# Patient Record
Sex: Male | Born: 1965 | Race: White | Hispanic: No | Marital: Married | Smoking: Never smoker
Health system: Southern US, Community
[De-identification: ages and names within clinical notes are randomized; demographics above are authoritative.]

## PROBLEM LIST (undated history)

## (undated) DIAGNOSIS — T8859XA Other complications of anesthesia, initial encounter: Secondary | ICD-10-CM

## (undated) DIAGNOSIS — M1990 Unspecified osteoarthritis, unspecified site: Secondary | ICD-10-CM

## (undated) DIAGNOSIS — K2970 Gastritis, unspecified, without bleeding: Secondary | ICD-10-CM

## (undated) DIAGNOSIS — K219 Gastro-esophageal reflux disease without esophagitis: Secondary | ICD-10-CM

## (undated) DIAGNOSIS — Z86010 Personal history of colonic polyps: Principal | ICD-10-CM

## (undated) DIAGNOSIS — T4145XA Adverse effect of unspecified anesthetic, initial encounter: Secondary | ICD-10-CM

## (undated) HISTORY — PX: LUMBAR FUSION: SHX111

## (undated) HISTORY — DX: Unspecified osteoarthritis, unspecified site: M19.90

## (undated) HISTORY — PX: NECK SURGERY: SHX720

## (undated) HISTORY — PX: UPPER GI ENDOSCOPY: SHX6162

## (undated) HISTORY — PX: BACK SURGERY: SHX140

## (undated) HISTORY — DX: Personal history of colonic polyps: Z86.010

---

## 2004-01-07 ENCOUNTER — Emergency Department (HOSPITAL_COMMUNITY): Admission: EM | Admit: 2004-01-07 | Discharge: 2004-01-07 | Payer: Self-pay | Admitting: Emergency Medicine

## 2005-10-19 ENCOUNTER — Inpatient Hospital Stay (HOSPITAL_COMMUNITY): Admission: RE | Admit: 2005-10-19 | Discharge: 2005-10-23 | Payer: Self-pay | Admitting: Neurosurgery

## 2005-10-21 ENCOUNTER — Ambulatory Visit: Payer: Self-pay | Admitting: Cardiology

## 2005-10-22 ENCOUNTER — Encounter: Payer: Self-pay | Admitting: Internal Medicine

## 2008-09-03 DIAGNOSIS — K2100 Gastro-esophageal reflux disease with esophagitis, without bleeding: Secondary | ICD-10-CM | POA: Insufficient documentation

## 2009-05-19 DIAGNOSIS — I10 Essential (primary) hypertension: Secondary | ICD-10-CM | POA: Insufficient documentation

## 2009-06-19 ENCOUNTER — Ambulatory Visit (HOSPITAL_COMMUNITY): Admission: RE | Admit: 2009-06-19 | Discharge: 2009-06-20 | Payer: Self-pay | Admitting: Neurosurgery

## 2010-03-30 DIAGNOSIS — L989 Disorder of the skin and subcutaneous tissue, unspecified: Secondary | ICD-10-CM | POA: Insufficient documentation

## 2010-08-31 ENCOUNTER — Ambulatory Visit: Payer: Self-pay | Admitting: Family Medicine

## 2011-03-21 LAB — URINALYSIS, ROUTINE W REFLEX MICROSCOPIC
Bilirubin Urine: NEGATIVE
Glucose, UA: NEGATIVE mg/dL
Hgb urine dipstick: NEGATIVE
Ketones, ur: NEGATIVE mg/dL
Nitrite: NEGATIVE
Protein, ur: NEGATIVE mg/dL
Specific Gravity, Urine: 1.025 (ref 1.005–1.030)
Urobilinogen, UA: 0.2 mg/dL (ref 0.0–1.0)
pH: 6 (ref 5.0–8.0)

## 2011-03-21 LAB — PROTIME-INR
INR: 0.9 (ref 0.00–1.49)
Prothrombin Time: 12.5 seconds (ref 11.6–15.2)

## 2011-03-21 LAB — CBC
HCT: 43.3 % (ref 39.0–52.0)
Hemoglobin: 15.3 g/dL (ref 13.0–17.0)
MCHC: 35.3 g/dL (ref 30.0–36.0)
MCV: 88.8 fL (ref 78.0–100.0)
Platelets: 177 10*3/uL (ref 150–400)
RBC: 4.88 MIL/uL (ref 4.22–5.81)
RDW: 14 % (ref 11.5–15.5)
WBC: 6.8 10*3/uL (ref 4.0–10.5)

## 2011-03-21 LAB — DIFFERENTIAL
Basophils Absolute: 0 10*3/uL (ref 0.0–0.1)
Basophils Relative: 0 % (ref 0–1)
Eosinophils Absolute: 0.5 10*3/uL (ref 0.0–0.7)
Eosinophils Relative: 7 % — ABNORMAL HIGH (ref 0–5)
Lymphocytes Relative: 32 % (ref 12–46)
Lymphs Abs: 2.2 10*3/uL (ref 0.7–4.0)
Monocytes Absolute: 0.5 10*3/uL (ref 0.1–1.0)
Monocytes Relative: 8 % (ref 3–12)
Neutro Abs: 3.6 10*3/uL (ref 1.7–7.7)
Neutrophils Relative %: 53 % (ref 43–77)

## 2011-03-21 LAB — BASIC METABOLIC PANEL
BUN: 12 mg/dL (ref 6–23)
CO2: 26 mEq/L (ref 19–32)
Calcium: 9.1 mg/dL (ref 8.4–10.5)
Chloride: 109 mEq/L (ref 96–112)
Creatinine, Ser: 0.87 mg/dL (ref 0.4–1.5)
GFR calc Af Amer: >60 mL/min (ref >60)
GFR calc non Af Amer: >60 mL/min (ref >60)
Glucose, Bld: 101 mg/dL — ABNORMAL HIGH (ref 70–99)
Potassium: 4.6 mEq/L (ref 3.5–5.1)
Sodium: 140 mEq/L (ref 135–145)

## 2011-03-21 LAB — APTT: aPTT: 28 seconds (ref 24–37)

## 2011-04-27 NOTE — Op Note (Signed)
NAME:  Maxwell Brown, Maxwell Brown NO.:  [id number]   MEDICAL RECORD NO.:  [id number]          PATIENT TYPE:  OIB   LOCATION:  3523                         FACILITY:  [facility]   PHYSICIAN:  Clydene Fake, M.D.  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   POSTOPERATIVE DIAGNOSES:  Herniated nucleus pulposus,spondylosis,  cervical stenosis at C5-6 and 6-7 with right-sided radiculopathy and  radiculomyelopathy.   PROCEDURES:  Anterior cervical decompression, diskectomy, and fusion at  C5-6 and 6-7 with LifeNet allograft bone, Trestle anterior cervical  plate.   SURGEON:  Clydene Fake, MD   ASSISTANT:  Hewitt Shorts, MD   ANESTHESIA:  General endotracheal tube anesthesia.   ESTIMATED BLOOD LOSS:  Minimal.   BLOOD GIVEN:  None.   DRAINS:  None.   COMPLICATIONS:  None.   INDICATIONS FOR PROCEDURE:  The patient is a 45 year old gentleman with  neck and right arm pain and numbness.  MRI shows spondylitic changes at  5-6, 6-7 with spurring and disk protrusion that caused some canal  stenosis at 5-6 and mild canal stenosis at 6-7, 5-6 worse to the right  side and irritating that root and 6-7 made actually worse to the left  but still some bifemoral narrowing, and the patient brought in for  decompression and fusion.   PROCEDURE IN DETAIL:  The patient was brought into the operating room  and general anesthesia was induced.  The patient was placed in a 10-  pound halter traction, prepped and draped in the sterile fashion.  Site  of incision injected with 10 mL of 1% lidocaine with epinephrine.  Incision was then made from the midline to the anterior border of the  sternocleidomastoid muscle on the left side.  The neck incision  was  taken down to the platysma and hemostasis was obtained with Bovie  cauterization.  The platysma muscle was incised with a Bovie and blunt  dissection taken through the anterior cervical fascia up to the anterior  cervical spine.  Needle was placed in interspace.  X-rays were obtained  showing this was at the 5-6 interspace.  Disk space was incised and  partial diskectomy was started with pituitary rongeurs.  Longus coli  muscle was reflected laterally from C5 through 7 and the self-retaining  retractor was placed and we can see both disk spaces.  Diskectomy then  continued by incising the disk space using pituitary rongeurs and  Kerrison punches removed anterior osteophytes.  We continued with the  diskectomy.  Distraction pins were placed in C5 and C7, and the  interspaces were distracted.  Microscope was brought in for  microdissection at this point and diskectomy continued with curettes,  pituitary rongeurs, and then 1- and 2-mm Kerrison punches were used to  remove posterior osteophyte disk and ligament decompressing the central  canal and then performing bilateral foraminotomies.  When we were  finished at the 5-6 level, we had good central decompression and  bilateral foramen were opened.  Nerve roots were well decompressed.  We  used a high-speed drill to remove cartilaginous endplate.  We measured  the height of disk space to be 5 mm and a 5-mm LifeNet allograft bone  was tapped into place, countersunk a couple of millimeters.  We checked  with a hook and there was plenty around and between the bone graft and  dura posteriorly.  We irrigated with antibiotic solution.  Attention was  then taken to the 6-7 level where diskectomy continued with pituitary  rongeurs and curettes and 1- and 2-mm Kerrison punches were used to  remove posterior disk osteophyte and ligament, decompressing the central  canal, and then bilateral foraminotomies were done decompressing the  nerve roots  bilaterally.  High-speed drill was used to remove  cartilaginous endplate.  We measured the height of disk space to be 6 mm  and a 6-mm LifeNet allograft bone was tapped into place, countersunk a  couple of millimeters.  Distraction pins were removed.  Weight was  removed from the traction and bone plugs were firmly in place.  We  irrigated with antibiotic solution.  We had good hemostasis.  Anterior  osteophytes were removed with Leksell rongeur, and the Trestle anterior  cervical plate was placed over the anterior cervical spine with two  screws placed in the C5, two in the C6, two in the C7 and these were  tightened down.  Lateral x-rays were obtained showing bone plugs, plate  and screws at 5-6 and 6-7 level.  We irrigated with antibiotic solution.  We had good hemostasis with bipolar cauterization and Gelfoam and  thrombin.  Gelfoam was irrigated out.  We had very good hemostasis, and  the platysma was closed with 3-0 Vicryl interrupted sutures,  subcutaneous tissue closed with the same.  Skin closed with benzoin and  Steri-Strips dressing was placed.  The patient was placed in a soft  cervical collar, awakened from anesthesia, and transferred to the  recovery room in stable condition.           ______________________________  Clydene Fake, M.D.     JRH/MEDQ  D:  06/19/2009  T:  06/19/2009  Job:  [id number]

## 2011-04-30 NOTE — Consult Note (Signed)
NAME:  Maxwell Brown, BEAVERS NO.:  [id number]   MEDICAL RECORD NO.:  [id number]          PATIENT TYPE:  INP   LOCATION:  3014                         FACILITY:  [facility]   PHYSICIAN:  Willa Rough, M.D.     DATE OF BIRTH:  May 31, 1966   DATE OF CONSULTATION:  DATE OF DISCHARGE:                                   CONSULTATION   [name] is a very pleasant, healthy, 45 year old gentleman.  He has had  a neurosurgical procedure to L5 and S1 for spondylolisthesis and foraminal  narrowing and degenerative disease and spondylosis.  He had a decompression  and fusion.   The patient has no significant medical problems.  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  He has no other problems and he  takes no significant medications.   On the days since surgery, the patient has had several episodes of syncope  and presyncope.  These have been witnessed.  On one occasion, he was sitting  and then began to feel poorly in general.  He had diaphoresis, and it was  noted that his heart rate increased to approximately 160 (there was no  monitor strip).  The patient's eyes then rolled back, and he was unconscious  very briefly.  There was no significant seizure activity noted.  He then  woke up.  This same type of event has occurred two or three times.   There has been no chest pain.  The patient has no prior history of  palpitations.   PAST MEDICAL HISTORY:   ALLERGIES:  THERE ARE NO KNOWN DRUG ALLERGIES.   MEDICATION:  The patient takes no regular medications.   OTHER MEDICAL PROBLEMS:  See the complete list below.   SOCIAL HISTORY:  The patient is employed as an Engineer, maintenance.  He is  married and does not smoke.   FAMILY HISTORY:  There is no strong family history of coronary disease.  There is no history of sudden cardiac death.   REVIEW OF SYSTEMS:  The patient has no significant complaints at this time.  Of course, he has some discomfort after  his back surgery and this is  improving.  He has not had a tremendous amount of pain.  He used only a  small amount of morphine from the morphine pump after his procedure.  His  review of systems otherwise is negative.   PHYSICAL EXAMINATION:  Blood pressure is at this time 140/70.  His pulse is  around 95.  I could not stand him up to check orthostatics because of his  surgery.  His temperature is 98.7, respirations 18.  The patient is oriented  to person, time and place.  Affect is normal.  Lungs are clear.  Respiratory  effort is not labored.  HEENT reveals no xanthelasma.  He has normal  extraocular motion.  There are no carotid bruits.  There is no jugular  venous distention.  Cardiac exam reveals an S1 with an S2.  There are no  clicks or significant murmurs.  The abdomen is soft.  There are no masses or  bruits.  There is no peripheral edema.  The patient of course is in bed  after his back surgery.   EKG reveals no significant abnormalities.  Other lab studies show no  significant abnormalities.   PROBLEMS INCLUDE:  1.  Status post low back surgery as described above.  2.  Episodes of presyncope and syncope.  This has occurred only here in the      hospital since his surgery.  As mentioned, these have been witnessed.      There is some warning.  There is also some tachycardia.  I believe that      the patient may have a vasovagal component to these episodes.  It is      also possible that he is having bursts of supraventricular tachycardia.      I do believe that he is mildly volume depleted at this time.   PLAN:  1.  Orthostatic blood pressure checks to be done carefully by the OT and PT      people.  2.  TSH.  3.  Place the patient on telemetry on __________  4.  2D echo.  5.  Increase his volume status with IV fluid.   I will follow these results and see the patient in followup.           ______________________________  Willa Rough, M.D.     JK/MEDQ  D:   10/21/2005  T:  10/21/2005  Job:  [id number]   cc:   Clydene Fake, M.D.  Fax: [phone]

## 2011-04-30 NOTE — Discharge Summary (Signed)
NAME:  Maxwell Brown, Maxwell Brown NO.:  [id number]   MEDICAL RECORD NO.:  [id number]          PATIENT TYPE:  INP   LOCATION:  3014                         FACILITY:  [facility]   PHYSICIAN:  Hilda Lias, M.D.   DATE OF BIRTH:  1966-01-01   DATE OF ADMISSION:  10/19/2005  DATE OF DISCHARGE:  10/23/2005                                 DISCHARGE SUMMARY   ADMISSION DIAGNOSES:  L5-S1 spondylolisthesis with chronic radiculopathy.   FINAL DIAGNOSES:  L5-S1 spondylolisthesis with chronic radiculopathy.   CLINICAL HISTORY:  The patient was admitted because of back pain with  radiation to both legs.  X-rays showed that he has spondylolisthesis at the  level of 5-1.  Surgery was advised.   LABORATORY DATA:  Normal.   COURSE IN HOSPITAL:  The patient was taken to surgery, and L5 Gill procedure  was done followed by interbody fusion and posterior arthrodesis with pedicle  screws.  The patient did really well, but he developed some difficulty with  fainting on several occasions.  The patient had a CBC which was within  normal limits.  The patient had evaluation by the cardiologist, and the  workup was negative.  Today he was seen by the cardiologist, and he is  cleared to go home without restriction.  There recommendation would be only  associated with the lumbar fusion.   CONDITION ON DISCHARGE:  Improvement.   DISCHARGE MEDICATIONS:  Percocet and Flexeril.   DIET:  Regular.   ACTIVITY:  The patient is not to drive for at least 10 days.  He is not to  bathe.  He is not to do any heavy lifting.   FOLLOW UP:  He is told to see Dr. Phoebe Perch in the next 3 to 4 weeks.           ______________________________  Hilda Lias, M.D.     EB/MEDQ  D:  10/23/2005  T:  10/24/2005  Job:  [id number]

## 2011-04-30 NOTE — Op Note (Signed)
NAME:  Maxwell Brown, Maxwell Brown NO.:  [id number]   MEDICAL RECORD NO.:  [id number]          PATIENT TYPE:  INP   LOCATION:  2899                         FACILITY:  [facility]   PHYSICIAN:  Clydene Fake, M.D.  DATE OF BIRTH:  05-Feb-1966   DATE OF PROCEDURE:  10/19/2005  DATE OF DISCHARGE:                                 OPERATIVE REPORT   PREOPERATIVE DIAGNOSES:  1.  Spondylolisthesis of L5-S1.  2.  Spondylosis.  3.  Degenerative disease with foraminal stenosis.   POSTOPERATIVE DIAGNOSES:  1.  Spondylolisthesis of L5-S1.  2.  Spondylosis.  3.  Degenerative disease with foraminal stenosis.   PROCEDURE:  1.  Gill decompressive laminectomy at L5-S1.  2.  Posterior lumbar interbody fusion, L5-S1.  3.  Saber interbody cage at L5-S1.  4.  Expedium nonsegmented pedicle screw fixation at L5-S1.  5.  Posterolateral fusion at L5-S1, autograft same incision.  6.  Symphony pedicle graft substitute (conduit).   SURGEON:  Clydene Fake, MD.   ASSISTANT:  Hilda Lias, MD.   ANESTHESIA:  General endotracheal tube.   ESTIMATED BLOOD LOSS:  1000 mL.   BLOOD REPLACED:  2 units Cell-Saver returned.   COMPLICATIONS:  None.   DRAINS:  None.   REASON FOR PROCEDURE:  The patient is a 45 year old, who two months ago had  back and right leg pain and also started getting some left leg pain, with  spondylolisthesis and retrolisthesis at L5-S1 with foraminal narrowing,  degenerative disease, and spondylosis.  The patient is brought in for  decompression and fusion.   PROCEDURE IN DETAIL:  The patient to the operating room, and a general  anesthesia was induced.  The patient was placed in the prone position on a  Wilson frame with all pressure points padded.  The patient was prepped and  draped in the usual sterile fashion.  The spinal incision was injected with  20 mL of 1% Lidocaine with epinephrine.  An incision was then made in the  midline, and a lower lumbar spine incision  was taken down to the fascia.  Hemostasis was obtained with Bovie cauterization.  The fascia was incised  with a Bovie, and subperiosteal dissection was done over the L4-5 and S1  spinous processes and lamina out to the facets bilaterally.  Fluoroscopic  imaging was used as markers.  X-ray confirmed our positioning.  Dissection  went out laterally to expose the lateral sacrum and the transverse processes  of L5.  A self-retaining retractor was placed.  Decompressive laminectomy  was then done, doing the L5 spinous process and lamina, then the medial  facets, and then decompressing through the pars bilaterally.  This  decompressed the 5 roots as they came out their foramen.  This was done with  Leksell rongeurs, Kerrison punches, and high-speed drills.  All bone was  saved, cleaned from its soft tissue, and chopped up into small pieces to use  later in the case for the fusion.  Foraminotomies were done over the S1  roots, again the 5 roots were decompressed as they went out laterally.  We  explored the disk space, the disk space was incised bilaterally.  Diskectomy  was performed, then interbody distractors were placed to distract up to 11  mm, and this reduced the spondylolisthesis.  We prepared the interbody space  for interbody cage fusion by using the various approaches removing the disk,  scraping the end plates, using curettes to scrape the end plate and push  this down and remove some lateral disk from both foramen on each side.  When  we were finished again, we did further decompression of the 5 roots  bilaterally, then the disk space readied for interbody fusion.  All the  autograft bone that was cleaned and chopped into small pieces was placed  through the Synthes system to give the plate rich plasma.  This bone mixture  was packed into two 11-high x 9-wide Saber cages, and the rest of the bone  graft packed into the interbody space.  With the Honan distraction on one  side, we  tapped the cage in the other, counter-sinking it a few millimeters,  and removed the interspace spreader, packed more bone in the interspace and  topped the second cage into place.  We had good position of the cages.  We  left this space open, and the nerve roots were decompressed.  Gelfoam and  thrombin were placed in the epidural space for hemostasis, and then we  decorticated the lateral facets and transverse processes and lateral sacrum  from L5 and S1 bilaterally.  Using fluoroscopy, point of entry points were  found at L5 on the left.  We placed a probe down the pedicle, placed a small  ball probe to fill the edges.  We had bony edges all the way around the top  of the pedicle and then placed a 15 mm x 6 mm Expedium screw.  The S1  pedicle was then found on this left side.  A 45 x 6 mm Expedium screw was  then placed.  This was repeated on the right side, the same screw sizes were  used.  Rods were placed into the screw heads, locking nuts were then placed,  and these were tightened down.  Final AP and lateral fluoroscopic imaging  was then obtained showing good position of screws, rods, and the interbody  bone plug was in good alignment of the spine.  The skin was irrigated with  antibiotic solution, and the rest of the autograft Symphony bone and  allograft substitute conduit were mixed with blood and bone marrow blood  from the pedicle walls.  This was then packed into the posterolateral space  for posterolateral fusion L5 to S1 bilaterally.  We then scored the nerve  roots again.  We had good decompression.  We had good hemostasis.  The  Gelfoam was left over the epidural space so that the bone fragments would  not compress the nerve roots.  The retractors were removed.  We had good  hemostasis.  The paraspinous muscles and then the fascia were closed with 0 Vicryl interrupted suture, the subcutaneous tissue closed with 0, 2-0, and 3-  0 vertical interrupted sutures, and the skin  closed with Benzoin and Steri-  Strips.  A dressing was placed.  The patient was placed back into the supine  position, awoke from anesthesia, and transported to the recovery room in  stable condition.           ______________________________  Clydene Fake, M.D.     JRH/MEDQ  D:  10/19/2005  T:  10/19/2005  Job:  [id number]

## 2011-10-29 ENCOUNTER — Ambulatory Visit (INDEPENDENT_AMBULATORY_CARE_PROVIDER_SITE_OTHER): Payer: PRIVATE HEALTH INSURANCE | Admitting: Gastroenterology

## 2011-10-29 ENCOUNTER — Encounter: Payer: Self-pay | Admitting: Gastroenterology

## 2011-10-29 VITALS — BP 102/70 | HR 72 | Ht 70.0 in | Wt 265.4 lb

## 2011-10-29 DIAGNOSIS — R1013 Epigastric pain: Secondary | ICD-10-CM | POA: Insufficient documentation

## 2011-10-29 DIAGNOSIS — M159 Polyosteoarthritis, unspecified: Secondary | ICD-10-CM | POA: Insufficient documentation

## 2011-10-29 NOTE — Progress Notes (Signed)
History of Present Illness:  This is a very pleasant 45 year old Caucasian male with degenerative arthritis of his large joints and back with previous multiple orthopedic procedures. His has been on NSAID therapy for several years, and developed a severe abdominal pain 3 months ago in the epigastric area which is currently responded well to Prilosec 20 mg a day. He denies lower gastrointestinal issues or rectal bleeding. The patient also denies any specific hepatobiliary complaints, and is scheduled ultrasound exam on November 20. He has not had previous barium studies or endoscopic procedures. His appetite is good, and his weight has been stable, and he denies any food intolerances. Family history remarkable for gallbladder disease in both his parents. Review of lab shows normal CBC and metabolic profile including liver function tests. There is a possible history of melena several months ago. He currently is using when necessary acetaminophen for pain.  I have reviewed this patient's present history, medical and surgical past history, allergies and medications.    Past Medical History  Diagnosis Date  . Arthritis    Past Surgical History  Procedure Date  . Back surgery   . Neck surgery     reports that he has never smoked. He has never used smokeless tobacco. He reports that he does not drink alcohol or use illicit drugs. family history includes Diabetes in his mother.  There is no history of Colon cancer. No Known Allergies    ROS: ... Has chronic back pain and diffuse myalgias. He denies current cardiopulmonary or urologic problems.     Physical Exam: General well developed well nourished patient in no acute distress, appearing his stated age Eyes PERRLA, no icterus, fundoscopic exam per opthamologist Skin no lesions noted Neck supple, no adenopathy, no thyroid enlargement, no tenderness Chest clear to percussion and auscultation Heart no significant murmurs, gallops or rubs  noted Abdomen no hepatosplenomegaly masses or tenderness, BS normal.  Extremities no acute joint lesions, edema, phlebitis or evidence of cellulitis. Neurologic patient oriented x 3, cranial nerves intact, no focal neurologic deficits noted. Psychological mental status normal and normal affect.  Assessment and plan: Probable NSAID induced gastric ulceration responding to PPI therapy. I've scheduled him for endoscopic exam, and we will do exams for H. pylori. Ultrasound scheduled as per above. He is to continue daily Prilosec with twice a day usage if needed. Hopefully he will be able to resume  NSAIDs once his endoscopic exam clarifies  the situation.  Encounter Diagnoses  Name Primary?  . Abdominal pain, epigastric Yes  . DJD (degenerative joint disease), multiple sites

## 2011-10-29 NOTE — Patient Instructions (Signed)
You have been scheduled for an Endoscopy with separate instructions given. Please continue the Prilosec.

## 2011-11-02 ENCOUNTER — Ambulatory Visit: Payer: Self-pay | Admitting: Family Medicine

## 2011-11-03 ENCOUNTER — Encounter: Payer: Self-pay | Admitting: Gastroenterology

## 2011-11-03 ENCOUNTER — Ambulatory Visit (AMBULATORY_SURGERY_CENTER): Payer: PRIVATE HEALTH INSURANCE | Admitting: Gastroenterology

## 2011-11-03 VITALS — BP 138/81 | HR 67 | Temp 96.3°F | Resp 14 | Ht 70.0 in | Wt 265.0 lb

## 2011-11-03 DIAGNOSIS — R1013 Epigastric pain: Secondary | ICD-10-CM

## 2011-11-03 DIAGNOSIS — K2970 Gastritis, unspecified, without bleeding: Secondary | ICD-10-CM

## 2011-11-03 MED ORDER — SODIUM CHLORIDE 0.9 % IV SOLN: 500.0000 mL | INTRAVENOUS | Status: DC

## 2011-11-03 NOTE — Progress Notes (Signed)
Propofol administered by s.[facility] crna per protocol. See scanned intra procedure report. ewm  200mg  propofol administered by [facility] crna for procedure. Procedure tolerated well per pt. ewm

## 2011-11-03 NOTE — Progress Notes (Signed)
Patient did not experience any of the following events: a burn prior to discharge; a fall within the facility; wrong site/side/patient/procedure/implant event; or a hospital transfer or hospital admission upon discharge from the facility. (G8907) Patient did not have preoperative order for IV antibiotic SSI prophylaxis. (G8918)  

## 2011-11-03 NOTE — Patient Instructions (Signed)
Please refer to the neon green sheet for instructions regarding activity for the rest of today.  Normal examination. Resume previous medications.

## 2011-11-05 DIAGNOSIS — K2970 Gastritis, unspecified, without bleeding: Secondary | ICD-10-CM

## 2011-11-05 DIAGNOSIS — K2990 Gastroduodenitis, unspecified, without bleeding: Secondary | ICD-10-CM

## 2011-11-05 LAB — HELICOBACTER PYLORI SCREEN-BIOPSY: UREASE: NEGATIVE

## 2011-11-08 ENCOUNTER — Telehealth: Payer: Self-pay | Admitting: *Deleted

## 2011-11-08 ENCOUNTER — Encounter: Payer: Self-pay | Admitting: Gastroenterology

## 2011-11-08 NOTE — Telephone Encounter (Signed)

## 2013-12-12 ENCOUNTER — Ambulatory Visit: Payer: Self-pay | Admitting: Family Medicine

## 2014-04-23 LAB — BASIC METABOLIC PANEL
BUN: 16 mg/dL (ref 4–21)
CREATININE: 0.9 mg/dL (ref <=1.3)
Glucose: 96 mg/dL
SODIUM: 145 mmol/L (ref 137–147)

## 2014-04-23 LAB — CBC AND DIFFERENTIAL
NEUTROS ABS: 51 /uL
WBC: 5.6 10*3/mL

## 2014-04-23 LAB — HEPATIC FUNCTION PANEL
ALT: 15 U/L (ref 10–40)
AST: 16 U/L (ref 14–40)
Alkaline Phosphatase: 58 U/L (ref 25–125)
Bilirubin, Total: 0.9 mg/dL

## 2014-04-23 LAB — PSA: PSA: 0.9

## 2014-04-23 LAB — LIPID PANEL
CHOLESTEROL: 157 mg/dL (ref 0–200)
HDL: 50 mg/dL (ref 35–70)
LDL Cholesterol: 92 mg/dL
LDl/HDL Ratio: 1.8
TRIGLYCERIDES: 73 mg/dL (ref 40–160)

## 2014-04-23 LAB — TSH: TSH: 1.55 u[IU]/mL (ref <=5.90)

## 2014-10-25 LAB — HEMOGLOBIN A1C: Hgb A1c MFr Bld: 5.3 % (ref 4.0–6.0)

## 2014-11-01 ENCOUNTER — Ambulatory Visit (INDEPENDENT_AMBULATORY_CARE_PROVIDER_SITE_OTHER): Payer: BC Managed Care – PPO

## 2014-11-01 ENCOUNTER — Ambulatory Visit (INDEPENDENT_AMBULATORY_CARE_PROVIDER_SITE_OTHER): Payer: BC Managed Care – PPO | Admitting: Podiatry

## 2014-11-01 ENCOUNTER — Encounter: Payer: Self-pay | Admitting: Podiatry

## 2014-11-01 VITALS — BP 114/74 | HR 76 | Resp 16 | Ht 70.0 in | Wt 260.0 lb

## 2014-11-01 DIAGNOSIS — M722 Plantar fascial fibromatosis: Secondary | ICD-10-CM

## 2014-11-01 MED ORDER — DICLOFENAC SODIUM 75 MG PO TBEC: 75.0000 mg | DELAYED_RELEASE_TABLET | Freq: Two times a day (BID) | ORAL | Status: DC

## 2014-11-01 MED ORDER — TRIAMCINOLONE ACETONIDE 10 MG/ML IJ SUSP
10.0000 mg | Freq: Once | INTRAMUSCULAR | Status: AC
  Administered 2014-11-01: 10 mg

## 2014-11-01 NOTE — Patient Instructions (Signed)

## 2014-11-01 NOTE — Progress Notes (Signed)
   Subjective:    Patient ID: Maxwell Brown, male    DOB: 09/30/1966, 48 y.o.   MRN: [id number]  HPI Comments: i have plantar fasciitis in my left heel. Ive had it since may. It does hurt and the pain comes and goes. It gets worse at times. Walking and standing bothers me. i wear otc inserts, used frozen water bottle, cold packs and that's it.  Foot Pain      Review of Systems  Musculoskeletal:       Joint pain Back pain Difficulty walking  All other systems reviewed and are negative.      Objective:   Physical Exam        Assessment & Plan:

## 2014-11-02 NOTE — Progress Notes (Signed)
Subjective:     Patient ID: Maxwell Brown, male   DOB: 04-30-1966, 48 y.o.   MRN: [id number]  HPI patient states that he's had a lot of pain in his left heel for about 6 months. He states that he tries to be active but is not been able to that he's been using over-the-counter insoles and using a frozen water bottle without relief of symptoms. Has had a history of this in the past   Review of Systems  All other systems reviewed and are negative.      Objective:   Physical Exam  Constitutional: He is oriented to person, place, and time.  Cardiovascular: Intact distal pulses.   Musculoskeletal: Normal range of motion.  Neurological: He is oriented to person, place, and time.  Skin: Skin is warm.  Nursing note and vitals reviewed.  neurovascular status intact muscle strength adequate with range of motion of the subtalar and midtarsal joint within normal limits. Patient is noted to have severe discomfort plantar aspect left heel at the insertion of the tendon into the calcaneus and is noted to have depression of the arch upon weightbearing. 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 Placed on diclofenac 75 mg twice a day and reappoint in 2 weeks and also discussed long-term orthotics

## 2014-11-22 ENCOUNTER — Ambulatory Visit (INDEPENDENT_AMBULATORY_CARE_PROVIDER_SITE_OTHER): Payer: BC Managed Care – PPO | Admitting: Podiatry

## 2014-11-22 DIAGNOSIS — M722 Plantar fascial fibromatosis: Secondary | ICD-10-CM

## 2014-11-23 NOTE — Progress Notes (Signed)
Subjective:     Patient ID: Maxwell Brown, male   DOB: [birth year], 48 y.o.   MRN: [id number]  HPI patient states that my heel is quite a bit improved but it is still painful and I don't know if I need another shot. I've had this a long time and been treated by other physicians and it does feel pretty good but still symptomatic   Review of Systems     Objective:   Physical Exam Neurovascular status unchanged with continued discomfort plantar aspect left heel with moderate inflammation and fluid buildup noted. Patient does have moderate depression of the arch which is complicating factor    Assessment:     Biomechanical dysfunction with chronic plantar fascial symptomatology left    Plan:     Reviewed condition and recommended physical therapy supportive shoe and scanned for custom orthotics to reduce plantar pressure. Want to avoid more cortisone if we can but may be necessary again in the future depending on how symptoms proceed

## 2014-12-27 ENCOUNTER — Ambulatory Visit (INDEPENDENT_AMBULATORY_CARE_PROVIDER_SITE_OTHER): Payer: 59 | Admitting: *Deleted

## 2014-12-27 DIAGNOSIS — M722 Plantar fascial fibromatosis: Secondary | ICD-10-CM

## 2014-12-27 NOTE — Patient Instructions (Signed)

## 2014-12-27 NOTE — Progress Notes (Signed)
Orthotics picked up. Given instructions. Will see in 1 mo.   for evaluation.

## 2015-01-14 ENCOUNTER — Other Ambulatory Visit (HOSPITAL_COMMUNITY): Payer: Self-pay | Admitting: Neurosurgery

## 2015-01-21 NOTE — Pre-Procedure Instructions (Signed)
Maxwell Brown  01/21/2015   Your procedure is scheduled on:  Friday, Feb. 12th   Report to [facility] Admitting at 10:00  AM.   Call this number if you have problems the morning of surgery: [phone]   Remember:   Do not eat food or drink liquids after midnight Thursday.   Take these medicines the morning of surgery with A SIP OF WATER: Omeprazole   Do not wear jewelry - no rings or watches.  Do not wear lotions or colognes.  You may NOT wear deodorant day of surgery.   Men may shave face and neck.   Do not bring valuables to the hospital.  [facility] is not responsible for any belongings or valuables.               Contacts, dentures or bridgework may not be worn into surgery.  Leave suitcase in the car. After surgery it may be brought to your room.  For patients admitted to the hospital, discharge time is determined by your treatment team.               Patients discharged the day of surgery will not be allowed to drive home.   Name and phone number of your driver:    Special Instructions: "Preparing for Surgery" instruction sheet.   Please read over the following fact sheets that you were given: Pain Booklet, Coughing and Deep Breathing, Blood Transfusion Information, MRSA Information and Surgical Site Infection Prevention

## 2015-01-22 ENCOUNTER — Encounter (HOSPITAL_COMMUNITY): Payer: Self-pay

## 2015-01-22 ENCOUNTER — Encounter (HOSPITAL_COMMUNITY)
Admission: RE | Admit: 2015-01-22 | Discharge: 2015-01-22 | Disposition: A | Discharge status: Home / Self Care | Payer: 59 | Source: Ambulatory Visit | Attending: Neurosurgery | Admitting: Neurosurgery

## 2015-01-22 HISTORY — DX: Gastritis, unspecified, without bleeding: K29.70

## 2015-01-22 HISTORY — DX: Other complications of anesthesia, initial encounter: T88.59XA

## 2015-01-22 HISTORY — DX: Adverse effect of unspecified anesthetic, initial encounter: T41.45XA

## 2015-01-22 HISTORY — DX: Gastro-esophageal reflux disease without esophagitis: K21.9

## 2015-01-22 LAB — SURGICAL PCR SCREEN
MRSA, PCR: NEGATIVE
Staphylococcus aureus: NEGATIVE

## 2015-01-22 LAB — CBC
HEMATOCRIT: 44.3 % (ref 39.0–52.0)
Hemoglobin: 15.1 g/dL (ref 13.0–17.0)
MCH: 30.3 pg (ref 26.0–34.0)
MCHC: 34.1 g/dL (ref 30.0–36.0)
MCV: 89 fL (ref 78.0–100.0)
PLATELETS: 183 10*3/uL (ref 150–400)
RBC: 4.98 MIL/uL (ref 4.22–5.81)
RDW: 13.6 % (ref 11.5–15.5)
WBC: 6.3 10*3/uL (ref 4.0–10.5)

## 2015-01-22 LAB — BASIC METABOLIC PANEL
Anion gap: 7 (ref 5–15)
BUN: 11 mg/dL (ref 6–23)
CHLORIDE: 110 mmol/L (ref 96–112)
CO2: 25 mmol/L (ref 19–32)
CREATININE: 0.95 mg/dL (ref 0.50–1.35)
Calcium: 9 mg/dL (ref 8.4–10.5)
GFR calc Af Amer: >90 mL/min (ref >90)
GFR calc non Af Amer: >90 mL/min (ref >90)
GLUCOSE: 102 mg/dL — AB (ref 70–99)
POTASSIUM: 4 mmol/L (ref 3.5–5.1)
Sodium: 142 mmol/L (ref 135–145)

## 2015-01-22 LAB — TYPE AND SCREEN
ABO/RH(D): A POS
Antibody Screen: NEGATIVE

## 2015-01-23 LAB — ABO/RH: ABO/RH(D): A POS

## 2015-01-23 MED ORDER — DEXTROSE 5 % IV SOLN
3.0000 g | INTRAVENOUS | Status: AC
  Administered 2015-01-24 (×2): 3 g via INTRAVENOUS
  Filled 2015-01-23 (×2): qty 3000

## 2015-01-24 ENCOUNTER — Inpatient Hospital Stay (HOSPITAL_COMMUNITY): Payer: 59 | Admitting: Anesthesiology

## 2015-01-24 ENCOUNTER — Inpatient Hospital Stay (HOSPITAL_COMMUNITY): Payer: 59

## 2015-01-24 ENCOUNTER — Inpatient Hospital Stay (HOSPITAL_COMMUNITY)
Admission: RE | Admit: 2015-01-24 | Discharge: 2015-01-26 | DRG: 460 | Disposition: A | Discharge status: Home / Self Care | Payer: 59 | Source: Ambulatory Visit | Attending: Neurosurgery | Admitting: Neurosurgery

## 2015-01-24 ENCOUNTER — Encounter (HOSPITAL_COMMUNITY): Payer: Self-pay | Admitting: *Deleted

## 2015-01-24 ENCOUNTER — Encounter (HOSPITAL_COMMUNITY)
Admission: RE | Disposition: A | Discharge status: Home / Self Care | Payer: Self-pay | Source: Ambulatory Visit | Attending: Neurosurgery

## 2015-01-24 DIAGNOSIS — M4326 Fusion of spine, lumbar region: Secondary | ICD-10-CM

## 2015-01-24 DIAGNOSIS — M4316 Spondylolisthesis, lumbar region: Secondary | ICD-10-CM | POA: Diagnosis present

## 2015-01-24 DIAGNOSIS — Z833 Family history of diabetes mellitus: Secondary | ICD-10-CM

## 2015-01-24 DIAGNOSIS — M5126 Other intervertebral disc displacement, lumbar region: Principal | ICD-10-CM | POA: Diagnosis present

## 2015-01-24 DIAGNOSIS — K219 Gastro-esophageal reflux disease without esophagitis: Secondary | ICD-10-CM | POA: Diagnosis present

## 2015-01-24 DIAGNOSIS — M545 Low back pain: Secondary | ICD-10-CM | POA: Diagnosis present

## 2015-01-24 SURGERY — POSTERIOR LUMBAR FUSION 1 LEVEL
Anesthesia: General | Site: Back | Laterality: Bilateral

## 2015-01-24 MED ORDER — ESMOLOL HCL 10 MG/ML IV SOLN
INTRAVENOUS | Status: AC
  Filled 2015-01-24: qty 10

## 2015-01-24 MED ORDER — MORPHINE SULFATE 2 MG/ML IJ SOLN: 1.0000 mg | INTRAMUSCULAR | Status: DC | PRN

## 2015-01-24 MED ORDER — MENTHOL 3 MG MT LOZG: 1.0000 | LOZENGE | OROMUCOSAL | Status: DC | PRN

## 2015-01-24 MED ORDER — FENTANYL CITRATE 0.05 MG/ML IJ SOLN
INTRAMUSCULAR | Status: DC | PRN
  Administered 2015-01-24 (×7): 50 ug via INTRAVENOUS
  Administered 2015-01-24: 100 ug via INTRAVENOUS
  Administered 2015-01-24 (×2): 50 ug via INTRAVENOUS

## 2015-01-24 MED ORDER — CEFAZOLIN SODIUM-DEXTROSE 2-3 GM-% IV SOLR
2.0000 g | Freq: Three times a day (TID) | INTRAVENOUS | Status: AC
  Administered 2015-01-25 (×2): 2 g via INTRAVENOUS
  Filled 2015-01-24 (×2): qty 50

## 2015-01-24 MED ORDER — SODIUM CHLORIDE 0.9 % IJ SOLN: 3.0000 mL | Freq: Two times a day (BID) | INTRAMUSCULAR | Status: DC

## 2015-01-24 MED ORDER — ACETAMINOPHEN 325 MG PO TABS
650.0000 mg | ORAL_TABLET | ORAL | Status: DC | PRN
  Administered 2015-01-25 – 2015-01-26 (×3): 650 mg via ORAL
  Filled 2015-01-24 (×3): qty 2

## 2015-01-24 MED ORDER — ROCURONIUM BROMIDE 50 MG/5ML IV SOLN
INTRAVENOUS | Status: AC
  Filled 2015-01-24: qty 1

## 2015-01-24 MED ORDER — SODIUM CHLORIDE 0.9 % IJ SOLN: 3.0000 mL | INTRAMUSCULAR | Status: DC | PRN

## 2015-01-24 MED ORDER — PROMETHAZINE HCL 25 MG/ML IJ SOLN
6.2500 mg | INTRAMUSCULAR | Status: DC | PRN
Start: 2015-01-24 — End: 2015-01-24

## 2015-01-24 MED ORDER — THROMBIN 20000 UNITS EX SOLR
CUTANEOUS | Status: DC | PRN
  Administered 2015-01-24: 13:00:00 via TOPICAL

## 2015-01-24 MED ORDER — SENNA 8.6 MG PO TABS
1.0000 | ORAL_TABLET | Freq: Two times a day (BID) | ORAL | Status: DC
  Administered 2015-01-24 – 2015-01-26 (×4): 8.6 mg via ORAL
  Filled 2015-01-24 (×4): qty 1

## 2015-01-24 MED ORDER — PHENYLEPHRINE HCL 10 MG/ML IJ SOLN
INTRAMUSCULAR | Status: DC | PRN
  Administered 2015-01-24 (×5): 80 ug via INTRAVENOUS
  Administered 2015-01-24: 40 ug via INTRAVENOUS
  Administered 2015-01-24 (×3): 80 ug via INTRAVENOUS

## 2015-01-24 MED ORDER — SUCCINYLCHOLINE CHLORIDE 20 MG/ML IJ SOLN
INTRAMUSCULAR | Status: DC | PRN
  Administered 2015-01-24: 140 mg via INTRAVENOUS

## 2015-01-24 MED ORDER — FENTANYL CITRATE 0.05 MG/ML IJ SOLN
INTRAMUSCULAR | Status: AC
  Filled 2015-01-24: qty 5

## 2015-01-24 MED ORDER — OXYCODONE-ACETAMINOPHEN 5-325 MG PO TABS: 1.0000 | ORAL_TABLET | ORAL | Status: DC | PRN

## 2015-01-24 MED ORDER — PROPOFOL 10 MG/ML IV BOLUS
INTRAVENOUS | Status: AC
  Filled 2015-01-24: qty 20

## 2015-01-24 MED ORDER — GLYCOPYRROLATE 0.2 MG/ML IJ SOLN
INTRAMUSCULAR | Status: DC | PRN
  Administered 2015-01-24: 0.6 mg via INTRAVENOUS

## 2015-01-24 MED ORDER — PHENYLEPHRINE 40 MCG/ML (10ML) SYRINGE FOR IV PUSH (FOR BLOOD PRESSURE SUPPORT)
PREFILLED_SYRINGE | INTRAVENOUS | Status: AC
  Filled 2015-01-24: qty 20

## 2015-01-24 MED ORDER — CEFAZOLIN SODIUM-DEXTROSE 2-3 GM-% IV SOLR
INTRAVENOUS | Status: AC
  Filled 2015-01-24: qty 50

## 2015-01-24 MED ORDER — HYDROCODONE-ACETAMINOPHEN 5-325 MG PO TABS: 1.0000 | ORAL_TABLET | ORAL | Status: DC | PRN

## 2015-01-24 MED ORDER — ONDANSETRON HCL 4 MG/2ML IJ SOLN
4.0000 mg | INTRAMUSCULAR | Status: DC | PRN
Start: 2015-01-24 — End: 2015-01-26

## 2015-01-24 MED ORDER — EPHEDRINE SULFATE 50 MG/ML IJ SOLN
INTRAMUSCULAR | Status: DC | PRN
  Administered 2015-01-24: 15 mg via INTRAVENOUS
  Administered 2015-01-24: 10 mg via INTRAVENOUS

## 2015-01-24 MED ORDER — HYDROMORPHONE HCL 1 MG/ML IJ SOLN
0.2500 mg | INTRAMUSCULAR | Status: DC | PRN
  Administered 2015-01-24 (×2): 0.5 mg via INTRAVENOUS

## 2015-01-24 MED ORDER — CEFAZOLIN SODIUM 1-5 GM-% IV SOLN
INTRAVENOUS | Status: AC
  Filled 2015-01-24: qty 50

## 2015-01-24 MED ORDER — SODIUM CHLORIDE 0.9 % IV SOLN: 250.0000 mL | INTRAVENOUS | Status: DC

## 2015-01-24 MED ORDER — ACETAMINOPHEN 650 MG RE SUPP: 650.0000 mg | RECTAL | Status: DC | PRN

## 2015-01-24 MED ORDER — SODIUM CHLORIDE 0.9 % IJ SOLN
INTRAMUSCULAR | Status: AC
  Filled 2015-01-24: qty 10

## 2015-01-24 MED ORDER — PHENOL 1.4 % MT LIQD: 1.0000 | OROMUCOSAL | Status: DC | PRN

## 2015-01-24 MED ORDER — LIDOCAINE HCL (CARDIAC) 20 MG/ML IV SOLN
INTRAVENOUS | Status: AC
  Filled 2015-01-24: qty 10

## 2015-01-24 MED ORDER — MIDAZOLAM HCL 5 MG/5ML IJ SOLN
INTRAMUSCULAR | Status: DC | PRN
  Administered 2015-01-24: 2 mg via INTRAVENOUS

## 2015-01-24 MED ORDER — LIDOCAINE HCL (CARDIAC) 20 MG/ML IV SOLN
INTRAVENOUS | Status: DC | PRN
  Administered 2015-01-24: 100 mg via INTRAVENOUS

## 2015-01-24 MED ORDER — LIDOCAINE-EPINEPHRINE 0.5 %-1:200000 IJ SOLN
INTRAMUSCULAR | Status: DC | PRN
  Administered 2015-01-24: 10 mL
  Administered 2015-01-24: 30 mL

## 2015-01-24 MED ORDER — LACTATED RINGERS IV SOLN
INTRAVENOUS | Status: DC | PRN
  Administered 2015-01-24 (×3): via INTRAVENOUS

## 2015-01-24 MED ORDER — ONDANSETRON HCL 4 MG/2ML IJ SOLN
INTRAMUSCULAR | Status: AC
  Filled 2015-01-24: qty 2

## 2015-01-24 MED ORDER — PHENYLEPHRINE HCL 10 MG/ML IJ SOLN
10.0000 mg | INTRAMUSCULAR | Status: DC | PRN
  Administered 2015-01-24: 10 ug/min via INTRAVENOUS

## 2015-01-24 MED ORDER — 0.9 % SODIUM CHLORIDE (POUR BTL) OPTIME
TOPICAL | Status: DC | PRN
  Administered 2015-01-24: 1000 mL

## 2015-01-24 MED ORDER — DIAZEPAM 5 MG PO TABS
5.0000 mg | ORAL_TABLET | Freq: Four times a day (QID) | ORAL | Status: DC | PRN
  Administered 2015-01-24: 5 mg via ORAL

## 2015-01-24 MED ORDER — GLUCOSAMINE 500 MG PO CAPS: 4000.0000 mg | ORAL_CAPSULE | Freq: Two times a day (BID) | ORAL | Status: DC

## 2015-01-24 MED ORDER — NEOSTIGMINE METHYLSULFATE 10 MG/10ML IV SOLN
INTRAVENOUS | Status: DC | PRN
  Administered 2015-01-24: 4 mg via INTRAVENOUS

## 2015-01-24 MED ORDER — GLYCOPYRROLATE 0.2 MG/ML IJ SOLN
INTRAMUSCULAR | Status: AC
  Filled 2015-01-24: qty 3

## 2015-01-24 MED ORDER — PROPOFOL 10 MG/ML IV BOLUS
INTRAVENOUS | Status: DC | PRN
  Administered 2015-01-24: 200 mg via INTRAVENOUS
  Administered 2015-01-24: 30 mg via INTRAVENOUS

## 2015-01-24 MED ORDER — ONDANSETRON HCL 4 MG/2ML IJ SOLN
INTRAMUSCULAR | Status: DC | PRN
  Administered 2015-01-24: 4 mg via INTRAVENOUS

## 2015-01-24 MED ORDER — POLYETHYLENE GLYCOL 3350 17 G PO PACK: 17.0000 g | PACK | Freq: Every day | ORAL | Status: DC | PRN

## 2015-01-24 MED ORDER — SUCCINYLCHOLINE CHLORIDE 20 MG/ML IJ SOLN
INTRAMUSCULAR | Status: AC
  Filled 2015-01-24: qty 1

## 2015-01-24 MED ORDER — NEOSTIGMINE METHYLSULFATE 10 MG/10ML IV SOLN
INTRAVENOUS | Status: AC
  Filled 2015-01-24: qty 1

## 2015-01-24 MED ORDER — ARTIFICIAL TEARS OP OINT
TOPICAL_OINTMENT | OPHTHALMIC | Status: AC
  Filled 2015-01-24: qty 3.5

## 2015-01-24 MED ORDER — ALBUMIN HUMAN 5 % IV SOLN
INTRAVENOUS | Status: DC | PRN
  Administered 2015-01-24 (×2): via INTRAVENOUS

## 2015-01-24 MED ORDER — LACTATED RINGERS IV SOLN: INTRAVENOUS | Status: DC

## 2015-01-24 MED ORDER — ROCURONIUM BROMIDE 100 MG/10ML IV SOLN
INTRAVENOUS | Status: DC | PRN
  Administered 2015-01-24: 20 mg via INTRAVENOUS
  Administered 2015-01-24: 10 mg via INTRAVENOUS
  Administered 2015-01-24: 50 mg via INTRAVENOUS

## 2015-01-24 MED ORDER — DIAZEPAM 5 MG PO TABS
ORAL_TABLET | ORAL | Status: AC
Start: 2015-01-24 — End: 2015-01-25
  Filled 2015-01-24: qty 1

## 2015-01-24 MED ORDER — HYDROMORPHONE HCL 1 MG/ML IJ SOLN
INTRAMUSCULAR | Status: AC
  Filled 2015-01-24: qty 1

## 2015-01-24 MED ORDER — EPHEDRINE SULFATE 50 MG/ML IJ SOLN
INTRAMUSCULAR | Status: AC
  Filled 2015-01-24: qty 1

## 2015-01-24 MED ORDER — DIPHENHYDRAMINE HCL 25 MG PO CAPS
25.0000 mg | ORAL_CAPSULE | Freq: Every day | ORAL | Status: DC
  Administered 2015-01-25 – 2015-01-26 (×2): 25 mg via ORAL
  Filled 2015-01-24 (×2): qty 1

## 2015-01-24 MED ORDER — ARTIFICIAL TEARS OP OINT
TOPICAL_OINTMENT | OPHTHALMIC | Status: DC | PRN
  Administered 2015-01-24: 1 via OPHTHALMIC

## 2015-01-24 MED ORDER — PANTOPRAZOLE SODIUM 40 MG PO TBEC
40.0000 mg | DELAYED_RELEASE_TABLET | Freq: Every day | ORAL | Status: DC
  Administered 2015-01-25 – 2015-01-26 (×2): 40 mg via ORAL
  Filled 2015-01-24 (×2): qty 1

## 2015-01-24 MED ORDER — MIDAZOLAM HCL 2 MG/2ML IJ SOLN
INTRAMUSCULAR | Status: AC
  Filled 2015-01-24: qty 2

## 2015-01-24 MED ORDER — LYSINE 1000 MG PO TABS: 1000.0000 mg | ORAL_TABLET | Freq: Every day | ORAL | Status: DC

## 2015-01-24 MED ORDER — POTASSIUM CHLORIDE IN NACL 20-0.9 MEQ/L-% IV SOLN
INTRAVENOUS | Status: DC
  Administered 2015-01-24: 21:00:00 via INTRAVENOUS
  Filled 2015-01-24: qty 1000

## 2015-01-24 SURGICAL SUPPLY — 71 items
APL SKNCLS STERI-STRIP NONHPOA (GAUZE/BANDAGES/DRESSINGS)
BENZOIN TINCTURE PRP APPL 2/3 (GAUZE/BANDAGES/DRESSINGS) IMPLANT
BLADE CLIPPER SURG (BLADE) IMPLANT
BONE MATRIX VIVIGEN 5CC (Bone Implant) ×2 IMPLANT
BUR MATCHSTICK NEURO 3.0 LAGG (BURR) ×2 IMPLANT
CANISTER SUCT 3000ML PPV (MISCELLANEOUS) ×2 IMPLANT
CONT SPEC 4OZ CLIKSEAL STRL BL (MISCELLANEOUS) ×2 IMPLANT
COVER BACK TABLE 60X90IN (DRAPES) ×2 IMPLANT
DECANTER SPIKE VIAL GLASS SM (MISCELLANEOUS) ×2 IMPLANT
DRAPE C-ARM 42X72 X-RAY (DRAPES) ×4 IMPLANT
DRAPE C-ARMOR (DRAPES) ×1 IMPLANT
DRAPE LAPAROTOMY 100X72X124 (DRAPES) ×2 IMPLANT
DRAPE POUCH INSTRU U-SHP 10X18 (DRAPES) ×2 IMPLANT
DRAPE SURG 17X23 STRL (DRAPES) ×2 IMPLANT
DRSG OPSITE POSTOP 4X6 (GAUZE/BANDAGES/DRESSINGS) ×1 IMPLANT
DRSG TELFA 3X8 NADH (GAUZE/BANDAGES/DRESSINGS) IMPLANT
DURAPREP 26ML APPLICATOR (WOUND CARE) ×2 IMPLANT
ELECT BLADE 4.0 EZ CLEAN MEGAD (MISCELLANEOUS) ×2
ELECT REM PT RETURN 9FT ADLT (ELECTROSURGICAL) ×2
ELECTRODE BLDE 4.0 EZ CLN MEGD (MISCELLANEOUS) IMPLANT
ELECTRODE REM PT RTRN 9FT ADLT (ELECTROSURGICAL) ×1 IMPLANT
GAUZE SPONGE 4X4 12PLY STRL (GAUZE/BANDAGES/DRESSINGS) IMPLANT
GAUZE SPONGE 4X4 16PLY XRAY LF (GAUZE/BANDAGES/DRESSINGS) IMPLANT
GLOVE ECLIPSE 6.5 STRL STRAW (GLOVE) ×4 IMPLANT
GLOVE ECLIPSE 7.5 STRL STRAW (GLOVE) ×2 IMPLANT
GLOVE EXAM NITRILE LRG STRL (GLOVE) IMPLANT
GLOVE EXAM NITRILE MD LF STRL (GLOVE) IMPLANT
GLOVE EXAM NITRILE XL STR (GLOVE) IMPLANT
GLOVE EXAM NITRILE XS STR PU (GLOVE) IMPLANT
GLOVE INDICATOR 7.5 STRL GRN (GLOVE) ×2 IMPLANT
GLOVE INDICATOR 8.0 STRL GRN (GLOVE) ×1 IMPLANT
GLOVE SURG SS PI 7.0 STRL IVOR (GLOVE) ×2 IMPLANT
GOWN STRL REUS W/ TWL LRG LVL3 (GOWN DISPOSABLE) ×2 IMPLANT
GOWN STRL REUS W/ TWL XL LVL3 (GOWN DISPOSABLE) IMPLANT
GOWN STRL REUS W/TWL 2XL LVL3 (GOWN DISPOSABLE) IMPLANT
GOWN STRL REUS W/TWL LRG LVL3 (GOWN DISPOSABLE) ×4
GOWN STRL REUS W/TWL XL LVL3 (GOWN DISPOSABLE)
GRAFT BNE MATRIX VG 5 (Bone Implant) IMPLANT
KIT BASIN OR (CUSTOM PROCEDURE TRAY) ×2 IMPLANT
KIT POSITION SURG JACKSON T1 (MISCELLANEOUS) ×2 IMPLANT
KIT ROOM TURNOVER OR (KITS) ×2 IMPLANT
LIQUID BAND (GAUZE/BANDAGES/DRESSINGS) ×3 IMPLANT
MILL MEDIUM DISP (BLADE) ×1 IMPLANT
NDL HYPO 25X1 1.5 SAFETY (NEEDLE) ×1 IMPLANT
NDL SPNL 18GX3.5 QUINCKE PK (NEEDLE) IMPLANT
NEEDLE HYPO 25X1 1.5 SAFETY (NEEDLE) ×2 IMPLANT
NEEDLE SPNL 18GX3.5 QUINCKE PK (NEEDLE) ×2 IMPLANT
NS IRRIG 1000ML POUR BTL (IV SOLUTION) ×2 IMPLANT
PACK LAMINECTOMY NEURO (CUSTOM PROCEDURE TRAY) ×2 IMPLANT
PAD ARMBOARD 7.5X6 YLW CONV (MISCELLANEOUS) ×4 IMPLANT
PAD DRESSING TELFA 3X8 NADH (GAUZE/BANDAGES/DRESSINGS) IMPLANT
PLATE SPINOUS PROCESS 35MM (Plate) ×1 IMPLANT
ROD PRE BENT EXPEDIUM 35MM (Rod) ×1 IMPLANT
SCREW BREAKOFF M4 (Screw) ×1 IMPLANT
SCREW EXPEDIUM POLYAXIAL 7X40M (Screw) ×1 IMPLANT
SCREW SET SINGLE INNER (Screw) ×2 IMPLANT
SPACER PLIF FR DRY 11MM (Bone Implant) ×1 IMPLANT
SPONGE LAP 4X18 X RAY DECT (DISPOSABLE) IMPLANT
SPONGE SURGIFOAM ABS GEL 100 (HEMOSTASIS) ×2 IMPLANT
STRIP CLOSURE SKIN 1/2X4 (GAUZE/BANDAGES/DRESSINGS) IMPLANT
SUT PROLENE 6 0 BV (SUTURE) IMPLANT
SUT VIC AB 0 CT1 18XCR BRD8 (SUTURE) ×1 IMPLANT
SUT VIC AB 0 CT1 8-18 (SUTURE) ×2
SUT VIC AB 2-0 CT1 18 (SUTURE) ×2 IMPLANT
SUT VIC AB 3-0 SH 8-18 (SUTURE) ×2 IMPLANT
SYR 20ML ECCENTRIC (SYRINGE) ×2 IMPLANT
TOWEL OR 17X24 6PK STRL BLUE (TOWEL DISPOSABLE) ×2 IMPLANT
TOWEL OR 17X26 10 PK STRL BLUE (TOWEL DISPOSABLE) ×2 IMPLANT
TRAY FOLEY CATH 14FRSI W/METER (CATHETERS) ×1 IMPLANT
TRAY FOLEY CATH 16FRSI W/METER (SET/KITS/TRAYS/PACK) ×1 IMPLANT
WATER STERILE IRR 1000ML POUR (IV SOLUTION) ×2 IMPLANT

## 2015-01-24 NOTE — Progress Notes (Signed)
Patient received from PACU. Alert and oriented X4 welcomed to room 4N28 oriented to room call light system and phone  Patient rates pain 2/10 at this time Honeycomb dressing clean and dry  To back. Will continue to monitor.

## 2015-01-24 NOTE — Anesthesia Procedure Notes (Signed)
Procedure Name: Intubation Date/Time: 01/24/2015 1:49 PM Performed by: Scheryl Darter Pre-anesthesia Checklist: Patient identified, Emergency Drugs available, Suction available, Patient being monitored and Timeout performed Patient Re-evaluated:Patient Re-evaluated prior to inductionOxygen Delivery Method: Circle system utilized Preoxygenation: Pre-oxygenation with 100% oxygen Intubation Type: IV induction Ventilation: Mask ventilation without difficulty Laryngoscope Size: Miller and 3 Grade View: Grade II Tube type: Oral Tube size: 8.0 mm Number of attempts: 1 Airway Equipment and Method: Stylet Placement Confirmation: ETT inserted through vocal cords under direct vision,  positive ETCO2 and breath sounds checked- equal and bilateral Secured at: 23 cm Tube secured with: Tape Dental Injury: Teeth and Oropharynx as per pre-operative assessment

## 2015-01-24 NOTE — Transfer of Care (Signed)
Immediate Anesthesia Transfer of Care Note  Patient: Maxwell Brown  Procedure(s) Performed: Procedure(s) with comments: LUMBAR THREE-FOUR POSTERIOR LUMBAR INTERBODY FUSION WITH INTERBODY PROTHESIS POSTERIOR LATERAL ARTHRODESIS AND POSTERIOR SEGMENTED INSTRUMENTATION (Bilateral) - LUMBAR THREE-FOUR POSTERIOR LUMBAR INTERBODY FUSION WITH INTERBODY PROTHESIS POSTERIOR LATERAL ARTHRODESIS AND POSTERIOR SEGMENTED INSTRUMENTATION  Patient Location: PACU  Anesthesia Type:General  Level of Consciousness: sedated  Airway & Oxygen Therapy: Patient Spontanous Breathing and Patient connected to nasal cannula oxygen  Post-op Assessment: Report given to RN and Post -op Vital signs reviewed and stable  Post vital signs: Reviewed and stable  Last Vitals:  Filed Vitals:   01/24/15 1006  BP: 143/83  Pulse: 90  Temp: 36.3 C  Resp: 20    Complications: No apparent anesthesia complications

## 2015-01-24 NOTE — Op Note (Signed)
01/24/2015  6:42 PM  PATIENT:  Maxwell Brown  49 y.o. 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 male whom was taken to the operating room intubated, and placed under a general anesthetic without difficulty. A foley catheter was placed under sterile conditions. He was positioned prone on a Jackson stable with all pressure points properly padded.  His lumbar region was prepped and draped in a sterile manner. I infiltrated 10cc's 1/2%lidocaine/1:2000,000 strength epinephrine into the old incision. I opened the skin with a 10 blade and took the incision down to the thoracolumbar fascia. I exposed the lamina of L3, and L4 in a subperiosteal fashion bilaterally. I confirmed my location with an intraoperative xray, and with exposure of the pedicle screw placed at the prior surgery.  I placed self retaining retractors and started the decompression.  I decompressed the spinal canal by drilling out laterally. I drilled the pars and the inferior facet of L4 until I exposed the L4 root on the left.  I cauterized the the epidural veins, then opened the disc space.  A PLIF was performed at L4/5. I opened the disc space with a 15 blade then used a variety of instruments to remove the disc and prepare the space for the arthrodesis. I used curettes, rongeurs, punches, shavers for the disc space, and rasps in the discetomy. I  measured the disc space and placed an 88mm structural allograft  into the disc space(s).   I placed pedicle a screw at L4, using fluoroscopic guidance. I drilled a pilot hole, then cannulated the pedicle with a bone probe.  I then tapped the pedicle, assessing the site for pedicle violations. I eventually placed a 44mm screw after first coming out of the pedicle laterally, so I readjusted my trajectory. One screw(Depuy) was then placed without difficulty. Final films were performed and all screws appeared to be in good position.  We closed the wound in a layered fashion. We approximated the thoracolumbar fascia, subcutaneous, and subcuticular planes with vicryl sutures. I used dermabond and an occlusive dressing.     PLAN OF CARE: Admit to inpatient   PATIENT DISPOSITION:  PACU - hemodynamically stable.   Delay start of Pharmacological VTE agent (>24hrs) due to surgical blood loss or risk of bleeding:  yes

## 2015-01-24 NOTE — Progress Notes (Signed)
PHARMACIST - PHYSICIAN ORDER COMMUNICATION  CONCERNING: P&T Medication Policy on Herbal Medications  DESCRIPTION:  This patient's order for:  Glucosamine and lysine  have been noted.  This product(s) is classified as an "herbal" or natural product. Due to a lack of definitive safety studies or FDA approval, nonstandard manufacturing practices, plus the potential risk of unknown drug-drug interactions while on inpatient medications, the Pharmacy and Therapeutics Committee does not permit the use of "herbal" or natural products of this type within [facility].   ACTION TAKEN: The pharmacy department is unable to verify this order at this time and your patient has been informed of this safety policy. Please reevaluate patient's clinical condition at discharge and address if the herbal or natural product(s) should be resumed at that time.

## 2015-01-24 NOTE — Anesthesia Preprocedure Evaluation (Signed)
Anesthesia Evaluation  Patient identified by MRN, date of birth, ID band Patient awake    Reviewed: Allergy & Precautions, NPO status , Patient's Chart, lab work & pertinent test results  Airway Mallampati: II  TM Distance: >3 FB Neck ROM: Full    Dental   Pulmonary neg pulmonary ROS,  breath sounds clear to auscultation        Cardiovascular negative cardio ROS  Rhythm:Regular Rate:Normal     Neuro/Psych    GI/Hepatic Neg liver ROS, GERD-  ,  Endo/Other  negative endocrine ROS  Renal/GU negative Renal ROS     Musculoskeletal  (+) Arthritis -,   Abdominal   Peds  Hematology   Anesthesia Other Findings   Reproductive/Obstetrics                             Anesthesia Physical Anesthesia Plan  ASA: III  Anesthesia Plan: General   Post-op Pain Management:    Induction: Intravenous  Airway Management Planned: Oral ETT  Additional Equipment:   Intra-op Plan:   Post-operative Plan: Extubation in OR  Informed Consent: I have reviewed the patients History and Physical, chart, labs and discussed the procedure including the risks, benefits and alternatives for the proposed anesthesia with the patient or authorized representative who has indicated his/her understanding and acceptance.   Dental advisory given  Plan Discussed with: CRNA, Anesthesiologist and Surgeon  Anesthesia Plan Comments:         Anesthesia Quick Evaluation

## 2015-01-24 NOTE — H&P (Addendum)
BP 143/83 mmHg  Pulse 90  Temp(Src) 97.4 F (36.3 C) (Oral)  Resp 20  Ht 5' 10.5" (1.791 m)  Wt 122.97 kg (271 lb 1.6 oz)  BMI 38.34 kg/m2  SpO2 98% Maxwell Brown is a former patient of Dr. Luiz Ochoa who performed a lumbar fusion on [name] in 2005 at L4-L5(by radiology this is L5/s1).  At that time, he presented with severe pain in his right lower extremity.  He says it was bad enough that he needed an ambulance to bring him from home to our office.  He said that after that operation he did very well and continued to do well until about a year ago.  He says the pain started again in the lumbar spine, left buttocks, and left lower extremity.  It became much worse in November.  He says, for example, when grocery shopping that he is unable to walk without using the buggy and it does feel better if he can lean over to push the buggy.  If he walks, as he says, for a "spell" then he will have severe pain in his left back and left lower extremity.  He will oftentimes have excruciating pain which radiates into the left testicle.  He has numbness only from the knee to the foot on the left side.  No numbness whatsoever in the thigh.  No discomfort whatsoever in the right lower extremity.  He has no bowel or bladder dysfunction.  He has seen a chiropractor and saw him for approximately one month and was told that he had a pinched nerve causing his problem.  Only x-rays were done.  The chiropractic treatment did not offer significant relief.  Of note, Dr. Luiz Ochoa also performed cervical spine surgery on [name] in 2010.  He says the left thigh is very sore.  The left leg, again, the knee will frequently give out on him.  He is 49 years of age, self-employed.  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  He used to be a Dealer for approximately 20-30 years, but was unable to do that after the lumbar fusion.  He is right-handed.     REVIEW OF SYSTEMS:                                     Review of systems positive for leg weakness, back pain, leg pain and arthritis.  He denies constitutional, eye, ear, nose, throat, mouth, cardiovascular, respiratory, gastrointestinal, genitourinary, skin, neurological, psychiatric, endocrine, hematologic or allergic problems.  On his pain chart he lists pain only in the left thigh, buttocks and leg.  He says the pain is more sharp and severe now since September.      PAST MEDICAL HISTORY:                                Past medical history otherwise good.              Prior Operations:  He has undergone a fusion and a cervical fusion.              Medications and Allergies:  HAS AN ALLERGY TO BETADINE, WHICH CAUSES SKIN BLISTERS, AND ANESTHESIA, WHICH GIVES HIM FAINTING SPELLS.  He takes meloxicam, which he taken for the last two to three years; omeprazole; glucosamine; and lysine.  FAMILY HISTORY:                                            Mother is 69, in good health, has diabetes, atrial fibrillation.  Father is deceased.     SOCIAL HISTORY:                                            He does not smoke.  He does not use alcohol.  He does not use illicit drugs.  He does not have a history of drug abuse.     PHYSICAL EXAMINATION:                                Vital signs are as follows:  Height 5 feet 10 inches, weight 266.4 pounds, blood pressure 122/79, respiratory rate 15, temperature 98.2.  Pain is 8/10.     NEUROLOGICAL EXAMINATION:           On exam, he is alert, oriented x4, and answers all questions appropriately.  Memory, language, attention span, and fund of knowledge are normal.  Speech is clear and fluent.  He is well-kempt and in some distress.  He has an antalgic gait.  He is obese.  He has a negative Romberg test.  Reflexes 4+ at the knees, 2+ at the ankles, 1+ in the upper extremities.  He has intact proprioception in both the upper and lower extremities.  Pupils equal, round.  Full extraocular movements.   Full visual fields.  Symmetric facial sensation and movement.  Hearing intact to voice bilaterally.  Uvula elevates to  midline.  Shoulder shrug is normal.  Tongue protrudes in the midline.     IMAGING STUDIES:                                          Plain x-rays of the lumbar spine are reviewed.  Shows a solid fusion at L5-S1.  Hardware is intact.  There is some disk space narrowing at the L3-L4 level.  Otherwise, alignment is fairly good.  Nothing else is appreciated on these films that I believe to be related to his back pain.     HOPI:                                                   [name] comes in today so we could review the MRI.     DATA:                                                  He has a far lateral disc at L4/5 just above the level of his fusion.               IMPRESSION/PLAN:  I do believe this is the reason for the pain he has in his left leg. He is fairly large. Given the fact that he has a mild retrolisthesis there and he has the screws below, I don't think I can get through with this without using the level above. Risks and benefits were explained. He was very familiar with this having had the operation in 2005. We will make sure we have the information with regards to Dr. Luiz Ochoa.

## 2015-01-24 NOTE — Anesthesia Postprocedure Evaluation (Signed)
Anesthesia Post Note  Patient: Maxwell Brown  Procedure(s) Performed: Procedure(s) (LRB): LUMBAR THREE-FOUR POSTERIOR LUMBAR INTERBODY FUSION WITH INTERBODY PROTHESIS POSTERIOR LATERAL ARTHRODESIS AND POSTERIOR SEGMENTED INSTRUMENTATION (Bilateral)  Anesthesia type: General  Patient location: PACU  Post pain: Pain level controlled and Adequate analgesia  Post assessment: Post-op Vital signs reviewed, Patient's Cardiovascular Status Stable, Respiratory Function Stable, Patent Airway and Pain level controlled  Last Vitals:  Filed Vitals:   01/24/15 1930  BP: 110/55  Pulse: 107  Temp:   Resp: 23    Post vital signs: Reviewed and stable  Level of consciousness: awake, alert  and oriented  Complications: No apparent anesthesia complications

## 2015-01-25 LAB — GLUCOSE, CAPILLARY: Glucose-Capillary: 120 mg/dL — ABNORMAL HIGH (ref 70–99)

## 2015-01-25 NOTE — Progress Notes (Signed)
Orthopedic Tech Progress Note Patient Details:  Maxwell Brown [birth year] [id number] [employer] paged; Mortimer Fries called back to take brace order. Patient ID: Maxwell Brown, male   DOB: [birth year], 49 y.o.   MRN: [id number]   Fenton Foy 01/25/2015, 10:47 AM

## 2015-01-25 NOTE — Evaluation (Signed)
Physical Therapy Evaluation Patient Details Name: Maxwell Brown MRN: [id number] DOB: 25-Jun-1966 Today's Date: 01/25/2015   History of Present Illness  49 y.o. s/p LUMBAR THREE-FOUR POSTERIOR LUMBAR INTERBODY FUSION WITH INTERBODY PROTHESIS POSTERIOR LATERAL ARTHRODESIS AND POSTERIOR SEGMENTED INSTRUMENTATION (Bilateral).  Clinical Impression  Patient with good mobility s/p surgery.  Pain only near incision site and with reduced intensity compared to prior to admission.  Patient safe to ambulate on unit without device.  Patient may benefit from therapy to address stairs and any mobility concerns he/wife have prior to discharge home.    Follow Up Recommendations No PT follow up    Equipment Recommendations  None recommended by PT    Recommendations for Other Services       Precautions / Restrictions Precautions Precautions: Back Precaution Booklet Issued: Yes (comment) Precaution Comments: educated on precautions (3/3 precautions recalled) Required Braces or Orthoses: Spinal Brace Spinal Brace: Lumbar corset;Applied in sitting position Restrictions Weight Bearing Restrictions: No      Mobility  Bed Mobility Overal bed mobility: Modified Independent Bed Mobility: Rolling;Sidelying to Sit Rolling: Supervision (min cues for back precautions) Sidelying to sit: Supervision     Sit to sidelying: Modified independent (Device/Increase time) General bed mobility comments: cues for technique. Assist with trunk to get from sidelying to sitting position.  Transfers Overall transfer level: Independent Equipment used: None Transfers: Sit to/from Stand Sit to Stand: Supervision         General transfer comment: min cues for hand placement for safety  Ambulation/Gait Ambulation/Gait assistance: Supervision Ambulation Distance (Feet): 500 Feet Assistive device: None Gait Pattern/deviations: WFL(Within Functional Limits) Gait velocity: 20' in 6.92 sec = 2.17ft/sec Gait velocity  interpretation: >2.62 ft/sec, indicative of independent community ambulator    Stairs Stairs: Yes Stairs assistance: Supervision Stair Management: Two rails;Alternating pattern;Forwards Number of Stairs: 3    Wheelchair Mobility    Modified Rankin (Stroke Patients Only)       Balance Overall balance assessment: No apparent balance deficits (not formally assessed)                                           Pertinent Vitals/Pain Pain Assessment: 0-10 Pain Score: 4  Pain Location: back at incision site Pain Descriptors / Indicators: Sore Pain Intervention(s): Limited activity within patient's tolerance;Monitored during session    Home Living Family/patient expects to be discharged to:: Private residence Living Arrangements: Spouse/significant other Available Help at Discharge: Family;Available 24 hours/day Type of Home: House Home Access: Stairs to enter Entrance Stairs-Rails: Right;Left;Can reach both Entrance Stairs-Number of Steps: 4 Home Layout: One level Home Equipment: None (can borrow equipment from his church if needed)      Prior Function Level of Independence: Independent         Comments: works intermittently, otherwise, retired     Journalist, newspaper   Dominant Hand: Right    Extremity/Trunk Assessment   Upper Extremity Assessment: Overall WFL for tasks assessed           Lower Extremity Assessment: Overall WFL for tasks assessed      Cervical / Trunk Assessment: Normal  Communication   Communication: No difficulties  Cognition Arousal/Alertness: Awake/alert Behavior During Therapy: WFL for tasks assessed/performed Overall Cognitive Status: Within Functional Limits for tasks assessed                      General  Comments      Exercises        Assessment/Plan    PT Assessment Patient needs continued PT services  PT Diagnosis Difficulty walking   PT Problem List Decreased activity tolerance;Decreased  mobility;Decreased knowledge of precautions  PT Treatment Interventions Gait training;Stair training;Functional mobility training;Therapeutic exercise;Balance training;Patient/family education   PT Goals (Current goals can be found in the Care Plan section) Acute Rehab PT Goals Patient Stated Goal: return home PT Goal Formulation: With patient Time For Goal Achievement: 01/29/15 Potential to Achieve Goals: Good    Frequency Min 5X/week   Barriers to discharge        Co-evaluation               End of Session Equipment Utilized During Treatment: Back brace Activity Tolerance: Patient tolerated treatment well Patient left: in chair;with call bell/phone within reach;with family/visitor present Nurse Communication: Mobility status         Time: 1340-1402 PT Time Calculation (min) (ACUTE ONLY): 22 min   Charges:   PT Evaluation $Initial PT Evaluation Tier I: 1 Procedure     PT G CodesMalka Brown, PT [phone] [place] 01/25/2015, 2:12 PM

## 2015-01-25 NOTE — Progress Notes (Signed)
Patient ID: Maxwell Brown, male   DOB: 09-17-1966, 49 y.o.   MRN: [id number] Doing great. No pain, no weakness. To get a brace  Wants to go home in am

## 2015-01-25 NOTE — Progress Notes (Signed)
Foley cath D/C'd patient requested to sit at edge of bed after sitting for about 10 min patient felt dizzy and had a brief period of syncope he came around.  Back to baseline V/S T 97.9 P 90 B/P 133/83. Dr Ronnald Ramp on call made aware no new orders.

## 2015-01-25 NOTE — Evaluation (Addendum)
Occupational Therapy Evaluation Patient Details Name: Maxwell Brown MRN: [id number] DOB: [birth year] Today's Date: 01/25/2015    History of Present Illness 49 y.o. s/p LUMBAR THREE-FOUR POSTERIOR LUMBAR INTERBODY FUSION WITH INTERBODY PROTHESIS POSTERIOR LATERAL ARTHRODESIS AND POSTERIOR SEGMENTED INSTRUMENTATION (Bilateral).   Clinical Impression   Pt s/p above. Education provided in session and pt/spouse verbalized understanding. Feel pt is safe to d/c home, from OT standpoint, with wife available to assist as needed.    Follow Up Recommendations  No OT follow up;Supervision - Intermittent    Equipment Recommendations  Other (comment) (AE)    Recommendations for Other Services       Precautions / Restrictions Precautions Precautions: Back Precaution Booklet Issued: Yes (comment) Precaution Comments: educated on precautions Required Braces or Orthoses: Spinal Brace Spinal Brace: Lumbar corset;Applied in sitting position Restrictions Weight Bearing Restrictions: No      Mobility Bed Mobility Overal bed mobility: Needs Assistance Bed Mobility: Rolling;Sidelying to Sit;Sit to Sidelying Rolling: Supervision Sidelying to sit: Min assist     Sit to sidelying: Modified independent (Device/Increase time) General bed mobility comments: cues for technique. Assist with trunk to get from sidelying to sitting position.  Transfers Overall transfer level: Needs assistance   Transfers: Sit to/from Stand Sit to Stand: Min guard         General transfer comment: cues for hand placement/technique.     Balance  No LOB in session. Used walker for brief period and then ambulated without walker- Min guard.                                          ADL Overall ADL's : Needs assistance/impaired                     Lower Body Dressing: Sit to/from stand;With adaptive equipment;Minimal assistance   Toilet Transfer: Min guard;Ambulation (bed)    Toileting- Clothing Manipulation and Hygiene: Minimal assistance;Sit to/from stand Toileting - Clothing Manipulation Details (indicate cue type and reason): appeared to be twisting some when simulated hygiene     Functional mobility during ADLs: Min guard General ADL Comments: Educated on use of cup for oral care and placement of grooming items to avoid breaking precautions. Discussed incorporating precautions into functional activities. Educated on safety such as rugs/items on floor, safe shoewear, sitting for LB ADLs. Recommended spouse be with him for shower transfer. Educated on [place] they could purchase/cost as well as reviewed what pt could use for toilet aide and suggested baby wipes. Educated on back brace.  Took a few steps with RW but then OT had him ambulate without it. Pt donned underwear using reacher.     Vision     Perception     Praxis      Pertinent Vitals/Pain Pain Assessment: 0-10 Pain Score: 4  Pain Location: back Pain Descriptors / Indicators: Sore Pain Intervention(s): Repositioned;Monitored during session     Hand Dominance Right   Extremity/Trunk Assessment Upper Extremity Assessment Upper Extremity Assessment: Overall WFL for tasks assessed   Lower Extremity Assessment Lower Extremity Assessment: Defer to PT evaluation       Communication Communication Communication: No difficulties   Cognition Arousal/Alertness: Awake/alert Behavior During Therapy: WFL for tasks assessed/performed Overall Cognitive Status: Within Functional Limits for tasks assessed  General Comments       Exercises       Shoulder Instructions      Home Living Family/patient expects to be discharged to:: Private residence Living Arrangements: Spouse/significant other Available Help at Discharge: Family;Available 24 hours/day Type of Home: House Home Access: Stairs to enter [employer] of Steps: 4 Entrance Stairs-Rails:  Right;Left;Can reach both Home Layout: One level     Bathroom Shower/Tub: Occupational psychologist: Handicapped height (sink close)     Home Equipment: Shower seat - built in;Grab bars - tub/shower;Adaptive equipment Adaptive Equipment: Reacher        Prior Functioning/Environment Level of Independence: Independent             OT Diagnosis: Acute pain   OT Problem List:     OT Treatment/Interventions:      OT Goals(Current goals can be found in the care plan section)    OT Frequency:     Barriers to D/C:            Co-evaluation              End of Session Equipment Utilized During Treatment: Gait belt;Back brace;Rolling walker (pt used RW for brief period) Nurse Communication: Mobility status  Activity Tolerance: Patient tolerated treatment well Patient left: in bed;with call bell/phone within reach;with family/visitor present   Time: 1258-1316 OT Time Calculation (min): 18 min Charges:  OT General Charges $OT Visit: 1 Procedure OT Evaluation $Initial OT Evaluation Tier I: 1 Procedure G-CodesBenito Mccreedy OTR/L [id number] 01/25/2015, 1:30 PM

## 2015-01-26 NOTE — Progress Notes (Signed)
Patient discharged home with wife. RN discussed discharge instructions with patient. Patient states he understands, listed signs and symptoms of infection. IV removed by previous RN. Discharge instructions given to patient.

## 2015-01-26 NOTE — Discharge Instructions (Signed)
Spinal Fusion Care After Refer to this sheet in the next few weeks. These instructions provide you with information on caring for yourself after your procedure. Your caregiver may also give you more specific instructions. Your treatment has been planned according to current medical practices, but problems sometimes occur. Call your caregiver if you have any problems or questions after your procedure. HOME CARE INSTRUCTIONS   Take whatever pain medicine has been prescribed by your caregiver. Do not take over-the-counter pain medicine unless directed otherwise by your caregiver.  Do not drive if you are taking narcotic pain medicines.  Change your bandage (dressing) if necessary or as directed by your caregiver.  Do not get your surgical cut (incision) wet. After a few days you may take quick showers (rather than baths), but keep your incision clean and dry. Covering the incision with plastic wrap while you shower should keep your incision dry. A few weeks after surgery, once your incision has healed and your caregiver says it is okay, you can take baths or go swimming.  If you have been prescribed medicine to prevent your blood from clotting, follow the directions carefully.  Check the area around your incision often. Look for redness and swelling. Also, look for anything leaking from your wound. You can use a mirror or have a family member inspect your incision if it is in a place where it is difficult for you to see.  Ask your caregiver what activities you should avoid and for how long.  Walk as much as possible.  Do not lift anything heavier than 10 pounds (4.5 kilograms) until your caregiver says it is safe.  Do not twist or bend for a few weeks. Try not to pull on things. Avoid sitting for long periods of time. Change positions at least every hour.  Ask your caregiver what kinds of exercise you should do to make your back stronger and when you should begin doing these exercises. SEEK  IMMEDIATE MEDICAL CARE IF:   Pain suddenly becomes much worse.  The incision area is red, swollen, bleeding, or leaking fluid.  Your legs or feet become increasingly painful, numb, weak, or swollen.  You have trouble controlling urination or bowel movements.  You have trouble breathing.  You have chest pain.  You have a fever. MAKE SURE YOU:  Understand these instructions.  Will watch your condition.  Will get help right away if you are not doing well or get worse. Document Released: 06/18/2005 Document Revised: 02/21/2012 Document Reviewed: 02/11/2011 [facility] Patient Information 2015 [place], [place]. This information is not intended to replace advice given to you by your health care provider. Make sure you discuss any questions you have with your health care provider.  Fat and Cholesterol Control Diet Fat and cholesterol levels in your blood and organs are influenced by your diet. High levels of fat and cholesterol may lead to diseases of the heart, small and large blood vessels, gallbladder, liver, and pancreas. CONTROLLING FAT AND CHOLESTEROL WITH DIET Although exercise and lifestyle factors are important, your diet is key. That is because certain foods are known to raise cholesterol and others to lower it. The goal is to balance foods for their effect on cholesterol and more importantly, to replace saturated and trans fat with other types of fat, such as monounsaturated fat, polyunsaturated fat, and omega-3 fatty acids. On average, a person should consume no more than 15 to 17 g of saturated fat daily. Saturated and trans fats are considered "bad" fats, and  they will raise LDL cholesterol. Saturated fats are primarily found in animal products such as meats, butter, and cream. However, that does not mean you need to give up all your favorite foods. Today, there are good tasting, low-fat, low-cholesterol substitutes for most of the things you like to eat. Choose low-fat or nonfat  alternatives. Choose round or loin cuts of red meat. These types of cuts are lowest in fat and cholesterol. Chicken (without the skin), fish, veal, and ground [place] breast are great choices. Eliminate fatty meats, such as hot dogs and salami. Even shellfish have little or no saturated fat. Have a 3 oz (85 g) portion when you eat lean meat, poultry, or fish. Trans fats are also called "partially hydrogenated oils." They are oils that have been scientifically manipulated so that they are solid at room temperature resulting in a longer shelf life and improved taste and texture of foods in which they are added. Trans fats are found in stick margarine, some tub margarines, cookies, crackers, and baked goods.  When baking and cooking, oils are a great substitute for butter. The monounsaturated oils are especially beneficial since it is believed they lower LDL and raise HDL. The oils you should avoid entirely are saturated tropical oils, such as coconut and palm.  Remember to eat a lot from food groups that are naturally free of saturated and trans fat, including fish, fruit, vegetables, beans, grains (barley, rice, couscous, bulgur wheat), and pasta (without cream sauces).  IDENTIFYING FOODS THAT LOWER FAT AND CHOLESTEROL  Soluble fiber may lower your cholesterol. This type of fiber is found in fruits such as apples, vegetables such as broccoli, potatoes, and carrots, legumes such as beans, peas, and lentils, and grains such as barley. Foods fortified with plant sterols (phytosterol) may also lower cholesterol. You should eat at least 2 g per day of these foods for a cholesterol lowering effect.  Read package labels to identify low-saturated fats, trans fat free, and low-fat foods at the supermarket. Select cheeses that have only 2 to 3 g saturated fat per ounce. Use a heart-healthy tub margarine that is free of trans fats or partially hydrogenated oil. When buying baked goods (cookies, crackers), avoid partially  hydrogenated oils. Breads and muffins should be made from whole grains (whole-wheat or whole oat flour, instead of "flour" or "enriched flour"). Buy non-creamy canned soups with reduced salt and no added fats.  FOOD PREPARATION TECHNIQUES  Never deep-fry. If you must fry, either stir-fry, which uses very little fat, or use non-stick cooking sprays. When possible, broil, bake, or roast meats, and steam vegetables. Instead of putting butter or margarine on vegetables, use lemon and herbs, applesauce, and cinnamon (for squash and sweet potatoes). Use nonfat yogurt, salsa, and low-fat dressings for salads.  LOW-SATURATED FAT / LOW-FAT FOOD SUBSTITUTES Meats / Saturated Fat (g)  Avoid: Steak, marbled (3 oz/85 g) / 11 g  Choose: Steak, lean (3 oz/85 g) / 4 g  Avoid: Hamburger (3 oz/85 g) / 7 g  Choose: Hamburger, lean (3 oz/85 g) / 5 g  Avoid: Ham (3 oz/85 g) / 6 g  Choose: Ham, lean cut (3 oz/85 g) / 2.4 g  Avoid: Chicken, with skin, dark meat (3 oz/85 g) / 4 g  Choose: Chicken, skin removed, dark meat (3 oz/85 g) / 2 g  Avoid: Chicken, with skin, light meat (3 oz/85 g) / 2.5 g  Choose: Chicken, skin removed, light meat (3 oz/85 g) / 1 g Dairy / Saturated  Fat (g)  Avoid: Whole milk (1 cup) / 5 g  Choose: Low-fat milk, 2% (1 cup) / 3 g  Choose: Low-fat milk, 1% (1 cup) / 1.5 g  Choose: Skim milk (1 cup) / 0.3 g  Avoid: Hard cheese (1 oz/28 g) / 6 g  Choose: Skim milk cheese (1 oz/28 g) / 2 to 3 g  Avoid: Cottage cheese, 4% fat (1 cup) / 6.5 g  Choose: Low-fat cottage cheese, 1% fat (1 cup) / 1.5 g  Avoid: Ice cream (1 cup) / 9 g  Choose: Sherbet (1 cup) / 2.5 g  Choose: Nonfat frozen yogurt (1 cup) / 0.3 g  Choose: Frozen fruit bar / trace  Avoid: Whipped cream (1 tbs) / 3.5 g  Choose: Nondairy whipped topping (1 tbs) / 1 g Condiments / Saturated Fat (g)  Avoid: Mayonnaise (1 tbs) / 2 g  Choose: Low-fat mayonnaise (1 tbs) / 1 g  Avoid: Butter (1 tbs) / 7  g  Choose: Extra light margarine (1 tbs) / 1 g  Avoid: Coconut oil (1 tbs) / 11.8 g  Choose: Olive oil (1 tbs) / 1.8 g  Choose: Corn oil (1 tbs) / 1.7 g  Choose: Safflower oil (1 tbs) / 1.2 g  Choose: Sunflower oil (1 tbs) / 1.4 g  Choose: Soybean oil (1 tbs) / 2.4 g  Choose: Canola oil (1 tbs) / 1 g Document Released: 11/29/2005 Document Revised: 03/26/2013 Document Reviewed: 02/27/2014 [employer] Patient Information 2015 [place], [place]. This information is not intended to replace advice given to you by your health care provider. Make sure you discuss any questions you have with your health care provider.

## 2015-01-26 NOTE — Progress Notes (Signed)
Physical Therapy Treatment Patient Details Name: [name] MRN: [id number] DOB: 05-12-1966 Today's Date: 01/26/2015    History of Present Illness 49 y.o. s/p LUMBAR THREE-FOUR POSTERIOR LUMBAR INTERBODY FUSION WITH INTERBODY PROTHESIS POSTERIOR LATERAL ARTHRODESIS AND POSTERIOR SEGMENTED INSTRUMENTATION (Bilateral).    PT Comments    Pt ambulating unit with wife and at mod I. Able to manage stairs at mod I level. No further acute PT needs warranted. Safe to D/C home from mobility standpoint.   Follow Up Recommendations  No PT follow up     Equipment Recommendations  None recommended by PT    Recommendations for Other Services       Precautions / Restrictions Precautions Precautions: Back Precaution Comments: pt able to recall precautions and adhere throughout session Required Braces or Orthoses: Spinal Brace Spinal Brace: Lumbar corset;Applied in sitting position Restrictions Weight Bearing Restrictions: No    Mobility  Bed Mobility Overal bed mobility: Modified Independent             General bed mobility comments: performed log roll technique  Transfers Overall transfer level: Independent Equipment used: None             General transfer comment: good technique  Ambulation/Gait Ambulation/Gait assistance: Modified independent (Device/Increase time) Ambulation Distance (Feet): 520 Feet Assistive device: None Gait Pattern/deviations: WFL(Within Functional Limits) Gait velocity: guarded but WFL    General Gait Details: demo good ability to adhere to precautions   Stairs Stairs: Yes Stairs assistance: Modified independent (Device/Increase time) Stair Management: No rails;Step to pattern;Forwards Number of Stairs: 5 General stair comments: demo good ability to perform ; NO LOB noted  Wheelchair Mobility    Modified Rankin (Stroke Patients Only)       Balance Overall balance assessment: No apparent balance deficits (not formally  assessed)                                  Cognition Arousal/Alertness: Awake/alert Behavior During Therapy: WFL for tasks assessed/performed Overall Cognitive Status: Within Functional Limits for tasks assessed                      Exercises      General Comments        Pertinent Vitals/Pain Pain Assessment: 0-10 Pain Score: 2  Pain Location: surgical pain Pain Descriptors / Indicators: Aching Pain Intervention(s): Premedicated before session;Monitored during session;Repositioned    Home Living                      Prior Function            PT Goals (current goals can now be found in the care plan section) Acute Rehab PT Goals Patient Stated Goal: to go home today PT Goal Formulation: With patient Time For Goal Achievement: 01/29/15 Potential to Achieve Goals: Good Progress towards PT goals: Goals met/education completed, patient discharged from PT    Frequency  Min 5X/week    PT Plan Current plan remains appropriate    Co-evaluation             End of Session Equipment Utilized During Treatment: Back brace Activity Tolerance: Patient tolerated treatment well Patient left: in bed;with call bell/phone within reach;with family/visitor present     Time: 6712-4580 PT Time Calculation (min) (ACUTE ONLY): 10 min  Charges:  $Gait Training: 8-22 mins  G CodesGustavus Bryant , [place]  01/26/2015, 10:22 AM

## 2015-01-26 NOTE — Discharge Summary (Signed)
Physician Discharge Summary  Patient ID: Maxwell Brown MRN: [id number] DOB/AGE: August 21, 1966 49 y.o.  Admit date: 01/24/2015 Discharge date: 01/26/2015  Admission Diagnoses: Adjacent level stenosis   Discharge Diagnoses: Same   Discharged Condition: good  Hospital Course: The patient was admitted on 01/24/2015 and taken to the operating room where the patient underwent adjacent level fusion. The patient tolerated the procedure well and was taken to the recovery room and then to the floor in stable condition. The hospital course was routine. There were no complications. The wound remained clean dry and intact. Pt had appropriate back soreness. No complaints of leg pain or new N/T/W. The patient remained afebrile with stable vital signs, and tolerated a regular diet. The patient continued to increase activities, and pain was well controlled with oral pain medications.   Consults: None  Significant Diagnostic Studies:  Results for orders placed or performed during the hospital encounter of 01/24/15  Glucose, capillary  Result Value Ref Range   Glucose-Capillary 120 (H) 70 - 99 mg/dL    Dg Lumbar Spine 2-3 Views  01/24/2015   CLINICAL DATA:  PLIF of lumbar spine.  Arthritis.  EXAM: LUMBAR SPINE - 2-3 VIEW; DG C-ARM 61-120 MIN  COMPARISON:  MR of the lumbar spine 01/09/2015  FINDINGS: Previous posterior fusion at L5-S1. Intraoperative images are performed showing posterior retractor in place. Transpedicular screw has been placed at L4.  IMPRESSION: Intraoperative images during PLIF of the lumbar spine.   Electronically Signed   By: Nolon Nations M.D.   On: 01/24/2015 19:52   Dg C-arm 61-120 Min  01/24/2015   CLINICAL DATA:  PLIF of lumbar spine.  Arthritis.  EXAM: LUMBAR SPINE - 2-3 VIEW; DG C-ARM 61-120 MIN  COMPARISON:  MR of the lumbar spine 01/09/2015  FINDINGS: Previous posterior fusion at L5-S1. Intraoperative images are performed showing posterior retractor in place. Transpedicular  screw has been placed at L4.  IMPRESSION: Intraoperative images during PLIF of the lumbar spine.   Electronically Signed   By: Nolon Nations M.D.   On: 01/24/2015 19:52    Antibiotics:  Anti-infectives    Start     Dose/Rate Route Frequency Ordered Stop   01/25/15 0200  ceFAZolin (ANCEF) IVPB 2 g/50 mL premix     2 g 100 mL/hr over 30 Minutes Intravenous Every 8 hours 01/24/15 2007 01/25/15 1024   01/24/15 1750  ceFAZolin (ANCEF) 1-5 GM-% IVPB    Comments:  Latricia Heft   : cabinet override      01/24/15 1750 01/25/15 0559   01/24/15 0600  ceFAZolin (ANCEF) 3 g in dextrose 5 % 50 mL IVPB     3 g 160 mL/hr over 30 Minutes Intravenous On call to O.R. 01/23/15 1431 01/24/15 1749      Discharge Exam: Blood pressure 143/79, pulse 108, temperature 99.1 F (37.3 C), temperature source Oral, resp. rate 20, height 5\' 10"  (1.778 m), weight 271 lb 1.6 oz (122.97 kg), SpO2 97 %. Neurologic: Grossly normal Incision clean dry and intact  Discharge Medications:     Medication List    TAKE these medications        diphenhydrAMINE 25 mg capsule  Commonly known as:  BENADRYL  Take 25 mg by mouth daily.     GLUCOSAMINE PO  Take 4,000 mg by mouth 2 (two) times daily.     LYSINE PO  Take 1,000 mg by mouth daily.     omeprazole 20 MG capsule  Commonly known as:  PRILOSEC  Take 20 mg by mouth every morning.        Disposition: Home   Final Dx: Adjacent level fusion      Discharge Instructions    Call MD for:  difficulty breathing, headache or visual disturbances    Complete by:  As directed      Call MD for:  persistant nausea and vomiting    Complete by:  As directed      Call MD for:  redness, tenderness, or signs of infection (pain, swelling, redness, odor or green/yellow discharge around incision site)    Complete by:  As directed      Call MD for:  severe uncontrolled pain    Complete by:  As directed      Call MD for:  temperature >100.4    Complete by:  As directed       Diet - low sodium heart healthy    Complete by:  As directed      Discharge instructions    Complete by:  As directed   No heavy lifting, no driving, no bending or twisting     Increase activity slowly    Complete by:  As directed      Remove dressing in 48 hours    Complete by:  As directed               Signed: Patryk Conant S 01/26/2015, 11:22 AM

## 2015-01-28 ENCOUNTER — Ambulatory Visit: Payer: 59

## 2015-01-30 ENCOUNTER — Encounter (HOSPITAL_COMMUNITY): Payer: Self-pay | Admitting: Neurosurgery

## 2015-01-31 ENCOUNTER — Encounter (HOSPITAL_COMMUNITY): Payer: Self-pay | Admitting: Neurosurgery

## 2015-04-17 DIAGNOSIS — E669 Obesity, unspecified: Secondary | ICD-10-CM | POA: Insufficient documentation

## 2015-04-17 DIAGNOSIS — J309 Allergic rhinitis, unspecified: Secondary | ICD-10-CM | POA: Insufficient documentation

## 2015-04-17 DIAGNOSIS — R7303 Prediabetes: Secondary | ICD-10-CM | POA: Insufficient documentation

## 2015-04-17 DIAGNOSIS — K279 Peptic ulcer, site unspecified, unspecified as acute or chronic, without hemorrhage or perforation: Secondary | ICD-10-CM | POA: Insufficient documentation

## 2015-04-17 DIAGNOSIS — M722 Plantar fascial fibromatosis: Secondary | ICD-10-CM | POA: Insufficient documentation

## 2015-05-28 ENCOUNTER — Other Ambulatory Visit: Payer: Self-pay | Admitting: Family Medicine

## 2015-06-03 ENCOUNTER — Encounter: Payer: Self-pay | Admitting: Family Medicine

## 2015-06-03 ENCOUNTER — Ambulatory Visit (INDEPENDENT_AMBULATORY_CARE_PROVIDER_SITE_OTHER): Payer: 59 | Admitting: Family Medicine

## 2015-06-03 VITALS — BP 128/80 | HR 72 | Temp 97.9°F | Resp 16 | Ht 70.0 in | Wt 273.0 lb

## 2015-06-03 DIAGNOSIS — R5383 Other fatigue: Secondary | ICD-10-CM | POA: Diagnosis not present

## 2015-06-03 DIAGNOSIS — M2569 Stiffness of other specified joint, not elsewhere classified: Secondary | ICD-10-CM

## 2015-06-03 DIAGNOSIS — Z0000 Encounter for general adult medical examination without abnormal findings: Secondary | ICD-10-CM

## 2015-06-03 DIAGNOSIS — M2560 Stiffness of unspecified joint, not elsewhere classified: Secondary | ICD-10-CM

## 2015-06-03 DIAGNOSIS — Z125 Encounter for screening for malignant neoplasm of prostate: Secondary | ICD-10-CM | POA: Diagnosis not present

## 2015-06-03 LAB — POCT URINALYSIS DIPSTICK
Bilirubin, UA: NEGATIVE
Blood, UA: NEGATIVE
Glucose, UA: NEGATIVE
Ketones, UA: NEGATIVE
Leukocytes, UA: NEGATIVE
NITRITE UA: NEGATIVE
Protein, UA: NEGATIVE
SPEC GRAV UA: 1.025
UROBILINOGEN UA: 0.2
pH, UA: 6

## 2015-06-03 LAB — HEMOCCULT GUIAC POC 1CARD (OFFICE): FECAL OCCULT BLD: NEGATIVE

## 2015-06-03 NOTE — Progress Notes (Signed)
Patient ID: Maxwell Brown, male   DOB: [birth year], 49 y.o.   MRN: [id number] Patient: Maxwell Brown, Male    DOB: 30-Jan-1966, 49 y.o.   MRN: [id number] Visit Date: 06/03/2015  Today's Provider: [name], MD   Chief Complaint  Patient presents with  . Annual Exam   Subjective:   Maxwell Brown is a 49 y.o. male who presents today for his Annual Wellness Visit. He feels poorly. He reports exercising does not feel like exercising. He reports he is sleeping poorly, he reports that he is restless. Pt reports that since his back surgery, its like he can not bounce back from it. He does not feel well and he tired all the time.   Review of Systems  Constitutional: Positive for activity change and fatigue.  HENT: Negative.   Eyes: Negative.   Respiratory: Negative.   Cardiovascular: Negative.   Gastrointestinal: Negative.   Endocrine: Negative.   Genitourinary: Negative.   Musculoskeletal: Positive for myalgias, back pain, joint swelling, arthralgias, neck pain and neck stiffness.  Skin: Negative.   Allergic/Immunologic: Negative.   Neurological: Negative.   Hematological: Negative.   Psychiatric/Behavioral: Negative.     Patient Active Problem List   Diagnosis Date Noted  . Allergic rhinitis 04/17/2015  . Borderline diabetes 04/17/2015  . Adiposity 04/17/2015  . Plantar fasciitis 04/17/2015  . Peptic ulcer 04/17/2015  . HNP (herniated nucleus pulposus), lumbar 01/24/2015  . Gastritis 11/03/2011  . Abdominal pain, epigastric 10/29/2011  . DJD (degenerative joint disease), multiple sites 10/29/2011  . Dermatologic disease 03/30/2010  . Essential (primary) hypertension 05/19/2009  . Esophagitis, reflux 09/03/2008    History   Social History  . Marital Status: Married    Spouse Name: N/A  . Number of Children: N/A  . Years of Education: N/A   Occupational History  . [employer]    Social History Main Topics  . Smoking status: Never Smoker   . Smokeless tobacco:  Never Used  . Alcohol Use: No  . Drug Use: No  . Sexual Activity: Not on file   Other Topics Concern  . Not on file   Social History Narrative    Past Surgical History  Procedure Laterality Date  . Back surgery    . Neck surgery      His family history includes Bone cancer in his father; Diabetes in his mother; Heart disease in his mother; Lung cancer in his father; Transient ischemic attack in his mother. There is no history of Colon cancer.    Outpatient Prescriptions Prior to Visit  Medication Sig Dispense Refill  . diphenhydrAMINE (BENADRYL) 25 mg capsule Take 25 mg by mouth daily.    . Glucosamine HCl (GLUCOSAMINE PO) Take 4,000 mg by mouth 2 (two) times daily.     Marland Kitchen LYSINE PO Take 1,000 mg by mouth daily.     . meloxicam (MOBIC) 15 MG tablet Take by mouth.    Marland Kitchen omeprazole (PRILOSEC) 20 MG capsule TAKE ONE CAPSULE BY MOUTH EVERY DAY 30 capsule 5  . diclofenac (VOLTAREN) 75 MG EC tablet Take by mouth.     No facility-administered medications prior to visit.    Allergies  Allergen Reactions  . Other     Pt stated he had a surgery on 10/19/2004, was under deep anaesthesia, did well waking up, but had continuous episodes of passing out when trying to stand.   . Betadine [Povidone Iodine] Hives and Rash    Blistered skin  Patient Care Team: Jerrol Banana., MD as PCP - General (Unknown Physician Specialty)  Objective:   Vitals:  Filed Vitals:   06/03/15 0851  BP: 128/80  Pulse: 72  Temp: 97.9 F (36.6 C)  TempSrc: Oral  Resp: 16  Height: 5\' 10"  (1.778 m)  Weight: 273 lb (123.832 kg)    Physical Exam  Constitutional: He is oriented to person, place, and time. He appears well-developed and well-nourished.  Truncal obesity.  HENT:  Head: Normocephalic and atraumatic.  Right Ear: External ear normal.  Left Ear: External ear normal.  Nose: Nose normal.  Mouth/Throat: Oropharynx is clear and moist.  Eyes: Conjunctivae and EOM are normal. Pupils are  equal, round, and reactive to light.  Neck: Normal range of motion. Neck supple.  Cardiovascular: Normal rate, regular rhythm, normal heart sounds and intact distal pulses.   Pulmonary/Chest: Effort normal and breath sounds normal.  Abdominal: Soft. Bowel sounds are normal.  Genitourinary: Rectum normal, prostate normal and penis normal.  Musculoskeletal: Normal range of motion.  Neurological: He is alert and oriented to person, place, and time. He has normal reflexes.  Skin: Skin is warm and dry.  Psychiatric: He has a normal mood and affect. His behavior is normal. Judgment and thought content normal.       Assessment & Plan:     Annual Wellness Visit  Reviewed patient's Family Medical History Reviewed and updated list of patient's medical providers Assessed patient's functional ability Established a written schedule for health screening [facility] Completed and Reviewed  Exercise Activities and Dietary recommendations Goals    None      Immunization History  Administered Date(s) Administered  . Tdap 01/04/2011    Health Maintenance  Topic Date Due  . HIV Screening  05/01/1981  . INFLUENZA VACCINE  07/14/2015  . TETANUS/TDAP  01/04/2021   Fatigue  Obesity Diet and exercise discussed at some length   Discussed health benefits of physical activity, and encouraged him to engage in regular exercise appropriate for his age and condition.    Miguel Aschoff MD [facility] 06/03/2015 8:53 AM  ------------------------------------------------------------------------------------------------------------

## 2015-06-04 LAB — CBC WITH DIFFERENTIAL/PLATELET
Basophils Absolute: 0 x10E3/uL (ref 0.0–0.2)
Basos: 1 %
EOS (ABSOLUTE): 0.2 x10E3/uL (ref 0.0–0.4)
Eos: 4 %
Hematocrit: 44.7 % (ref 37.5–51.0)
Hemoglobin: 15.1 g/dL (ref 12.6–17.7)
Immature Grans (Abs): 0 x10E3/uL (ref 0.0–0.1)
Immature Granulocytes: 0 %
Lymphocytes Absolute: 2 x10E3/uL (ref 0.7–3.1)
Lymphs: 34 %
MCH: 29.3 pg (ref 26.6–33.0)
MCHC: 33.8 g/dL (ref 31.5–35.7)
MCV: 87 fL (ref 79–97)
Monocytes Absolute: 0.5 x10E3/uL (ref 0.1–0.9)
Monocytes: 8 %
Neutrophils Absolute: 3.1 x10E3/uL (ref 1.4–7.0)
Neutrophils: 53 %
Platelets: 184 x10E3/uL (ref 150–379)
RBC: 5.15 x10E6/uL (ref 4.14–5.80)
RDW: 14.8 % (ref 12.3–15.4)
WBC: 5.8 x10E3/uL (ref 3.4–10.8)

## 2015-06-04 LAB — COMPREHENSIVE METABOLIC PANEL WITH GFR
ALT: 24 IU/L (ref 0–44)
AST: 17 IU/L (ref 0–40)
Albumin/Globulin Ratio: 1.8 (ref 1.1–2.5)
Albumin: 4.3 g/dL (ref 3.5–5.5)
Alkaline Phosphatase: 72 IU/L (ref 39–117)
BUN/Creatinine Ratio: 15 (ref 9–20)
BUN: 10 mg/dL (ref 6–24)
Bilirubin Total: 0.5 mg/dL (ref 0.0–1.2)
CO2: 26 mmol/L (ref 18–29)
Calcium: 9 mg/dL (ref 8.7–10.2)
Chloride: 102 mmol/L (ref 97–108)
Creatinine, Ser: 0.66 mg/dL — ABNORMAL LOW (ref 0.76–1.27)
GFR calc Af Amer: 131 mL/min/1.73
GFR calc non Af Amer: 114 mL/min/1.73
Globulin, Total: 2.4 g/dL (ref 1.5–4.5)
Glucose: 91 mg/dL (ref 65–99)
Potassium: 4.7 mmol/L (ref 3.5–5.2)
Sodium: 142 mmol/L (ref 134–144)
Total Protein: 6.7 g/dL (ref 6.0–8.5)

## 2015-06-04 LAB — PSA: Prostate Specific Ag, Serum: 1 ng/mL (ref 0.0–4.0)

## 2015-06-04 LAB — LIPID PANEL WITH LDL/HDL RATIO
CHOLESTEROL TOTAL: 185 mg/dL (ref 100–199)
HDL: 48 mg/dL (ref >39)
LDL Calculated: 113 mg/dL — ABNORMAL HIGH (ref 0–99)
LDl/HDL Ratio: 2.4 ratio units (ref 0.0–3.6)
Triglycerides: 122 mg/dL (ref 0–149)
VLDL Cholesterol Cal: 24 mg/dL (ref 5–40)

## 2015-06-04 LAB — TSH: TSH: 2.75 u[IU]/mL (ref 0.450–4.500)

## 2015-06-04 LAB — TESTOSTERONE: Testosterone: 276 ng/dL — ABNORMAL LOW (ref 348–1197)

## 2015-06-05 ENCOUNTER — Telehealth: Payer: Self-pay

## 2015-06-05 NOTE — Telephone Encounter (Signed)
Spoke with patient and advised him of his labs. Patient wants to wait before starting Androgel. -aa

## 2015-06-05 NOTE — Telephone Encounter (Signed)
-----   Message from Jerrol Banana., MD sent at 06/05/2015  2:22 PM EDT ----- Labs okay except for mildly elevated cholesterol. Testosterone is low. He might benefit from treatment with topical testosterone. I am happy  to write prescription for AndroGel, 2 pumps daily I'm a 1.62%, 5 refills

## 2015-07-04 ENCOUNTER — Other Ambulatory Visit: Payer: Self-pay | Admitting: Family Medicine

## 2015-10-14 ENCOUNTER — Encounter: Payer: Self-pay | Admitting: Family Medicine

## 2015-10-14 ENCOUNTER — Ambulatory Visit (INDEPENDENT_AMBULATORY_CARE_PROVIDER_SITE_OTHER): Payer: 59 | Admitting: Family Medicine

## 2015-10-14 VITALS — BP 130/80 | HR 68 | Temp 98.0°F | Resp 16 | Ht 70.0 in | Wt 271.0 lb

## 2015-10-14 DIAGNOSIS — K219 Gastro-esophageal reflux disease without esophagitis: Secondary | ICD-10-CM

## 2015-10-14 DIAGNOSIS — E291 Testicular hypofunction: Secondary | ICD-10-CM

## 2015-10-14 DIAGNOSIS — R739 Hyperglycemia, unspecified: Secondary | ICD-10-CM | POA: Diagnosis not present

## 2015-10-14 NOTE — Progress Notes (Signed)
Patient ID: Maxwell Brown, male   DOB: Feb 10, 1966, 49 y.o.   MRN: [id number]   Maxwell Brown  MRN: [id number] DOB: September 24, 1966  Subjective:  HPI   1. Hyperglycemia Patient is a 49 year old male who presents for follow up of his hyperglycemia.  He has been having borderline glucose levels for about 1.5 years.  He does not check his sugar at home.  His last visit was on 06/03/15 and his last A1C was on 10/25/14 and was 5.3.  He has not experienced any symptoms to suggest hypoglycemic events nor has he had any indicating extremely high glucose levels.  2. Gastroesophageal reflux disease, esophagitis presence not specified  Patient is also here to follow up on his acid reflux.  He states he has been taking Omeprazole for at least 1.5 years.  He states his reflux is well controlled on the medication.  He takes it daily and has not ever tried going off of the medication.    3. Hypogonadism in male  Patient has had low testosterone levels in the past.  He has not been treated for this as he wanted to wait on any treatment.  He is due to have his level checked today.  It is of note that the patient has already had his Flu vaccine for this year.  Patient Active Problem List   Diagnosis Date Noted  . Allergic rhinitis 04/17/2015  . Borderline diabetes 04/17/2015  . Adiposity 04/17/2015  . Plantar fasciitis 04/17/2015  . Peptic ulcer 04/17/2015  . HNP (herniated nucleus pulposus), lumbar 01/24/2015  . Gastritis 11/03/2011  . Abdominal pain, epigastric 10/29/2011  . DJD (degenerative joint disease), multiple sites 10/29/2011  . Dermatologic disease 03/30/2010  . Essential (primary) hypertension 05/19/2009  . Esophagitis, reflux 09/03/2008    Past Medical History  Diagnosis Date  . Arthritis   . Complication of anesthesia     2005 had a 6-7 hr surgery, he had difficulty whenever he got up, would pass out.  heart tests were normal  "anesthesia" related he was told  . GERD (gastroesophageal  reflux disease)   . Gastritis     last flare up was 3 yrs ago    Social History   Social History  . Marital Status: Married    Spouse Name: N/A  . Number of Children: N/A  . Years of Education: N/A   Occupational History  . [employer]    Social History Main Topics  . Smoking status: Never Smoker   . Smokeless tobacco: Never Used  . Alcohol Use: No  . Drug Use: No  . Sexual Activity: Not on file   Other Topics Concern  . Not on file   Social History Narrative    Outpatient Prescriptions Prior to Visit  Medication Sig Dispense Refill  . diphenhydrAMINE (BENADRYL) 25 mg capsule Take 25 mg by mouth daily.    . Glucosamine HCl (GLUCOSAMINE PO) Take 4,000 mg by mouth 2 (two) times daily.     Marland Kitchen LYSINE PO Take 1,000 mg by mouth daily.     . meloxicam (MOBIC) 15 MG tablet TAKE 1 TABLET BY MOUTH EVERY MORNING 30 tablet 11  . omeprazole (PRILOSEC) 20 MG capsule TAKE ONE CAPSULE BY MOUTH EVERY DAY 30 capsule 5   No facility-administered medications prior to visit.    Allergies  Allergen Reactions  . Other     Pt stated he had a surgery on 10/19/2004, was under deep anaesthesia, did well waking up, but  had continuous episodes of passing out when trying to stand.   . Betadine [Povidone Iodine] Hives and Rash    Blistered skin     Review of Systems  Constitutional: Negative.   Respiratory: Negative.   Cardiovascular: Negative.   Gastrointestinal: Negative.   Neurological: Negative for dizziness and headaches.  Psychiatric/Behavioral: Negative for depression and memory loss. The patient is not nervous/anxious and does not have insomnia.    Objective:  BP 130/80 mmHg  Pulse 68  Temp(Src) 98 F (36.7 C) (Oral)  Resp 16  Ht 5\' 10"  (1.778 m)  Wt 271 lb (122.925 kg)  BMI 38.88 kg/m2  Physical Exam  Assessment and Plan :   1. Hyperglycemia/Prediabetes  - Hemoglobin A1c  2. Gastroesophageal reflux disease, esophagitis presence not specified Try to slowly cut PPI to  every other day then try to discontinue.HOB already elevated. GERD presently controlled but I am concerned about control with recent weight gain. 3. Hypogonadism in male  - Testosterone - Prolactin 4.Obesity with weight gain this year Diet and exercise habits discussed. 5.OA/Chronic Back Pain  Miguel Aschoff MD [facility] 10/14/2015 8:22 AM

## 2015-10-15 LAB — PROLACTIN: Prolactin: 13.4 ng/mL (ref 4.0–15.2)

## 2015-10-15 LAB — HEMOGLOBIN A1C
ESTIMATED AVERAGE GLUCOSE: 117 mg/dL
Hgb A1c MFr Bld: 5.7 % — ABNORMAL HIGH (ref 4.8–5.6)

## 2015-10-15 LAB — TESTOSTERONE: TESTOSTERONE: 236 ng/dL — AB (ref 348–1197)

## 2016-04-06 ENCOUNTER — Ambulatory Visit
Admission: RE | Admit: 2016-04-06 | Discharge: 2016-04-06 | Disposition: A | Discharge status: Home / Self Care | Payer: BLUE CROSS/BLUE SHIELD | Source: Ambulatory Visit | Attending: Family Medicine | Admitting: Family Medicine

## 2016-04-06 ENCOUNTER — Encounter: Payer: Self-pay | Admitting: Family Medicine

## 2016-04-06 ENCOUNTER — Ambulatory Visit (INDEPENDENT_AMBULATORY_CARE_PROVIDER_SITE_OTHER): Payer: BLUE CROSS/BLUE SHIELD | Admitting: Family Medicine

## 2016-04-06 VITALS — BP 112/72 | HR 86 | Temp 98.3°F | Resp 16 | Wt 273.0 lb

## 2016-04-06 DIAGNOSIS — R109 Unspecified abdominal pain: Secondary | ICD-10-CM

## 2016-04-06 DIAGNOSIS — N200 Calculus of kidney: Secondary | ICD-10-CM | POA: Diagnosis not present

## 2016-04-06 DIAGNOSIS — R1012 Left upper quadrant pain: Secondary | ICD-10-CM

## 2016-04-06 LAB — POCT URINALYSIS DIPSTICK
BILIRUBIN UA: NEGATIVE
GLUCOSE UA: NEGATIVE
Ketones, UA: NEGATIVE
Leukocytes, UA: NEGATIVE
NITRITE UA: NEGATIVE
PH UA: 6.5
SPEC GRAV UA: 1.02
UROBILINOGEN UA: 0.2

## 2016-04-06 MED ORDER — HYDROCODONE-ACETAMINOPHEN 10-325 MG PO TABS: 1.0000 | ORAL_TABLET | ORAL | Status: DC | PRN

## 2016-04-06 MED ORDER — PROMETHAZINE HCL 25 MG PO TABS: 25.0000 mg | ORAL_TABLET | Freq: Four times a day (QID) | ORAL | Status: DC | PRN

## 2016-04-06 NOTE — Progress Notes (Addendum)
Patient ID: Maxwell Brown, male   DOB: [birth year], 50 y.o.   MRN: [id number]    Subjective:  HPI Pt reports that he started having abdominal pain in the LLQ this past Friday night. He thought he had eaten something bad, but he did not have the nausea and vomiting with it. Then he started hurting in the same area but in the back. He reports that he has had decreased urine stream, no dysuria or visible blood in the urine. He reports that he was hurting so bad this morning that he could not get comfortable at all. He took about 3-4 aleve and it got better so that he could go back to sleep. Pt reports that sometimes when he has the pain it feels like he has to urinate or have a bowel movement. He has had so much pain where he has been nauseated but has not vomited. He reports that he has felt like he has had a fever on and off.    Prior to Admission medications   Medication Sig Start Date End Date Taking? Authorizing Provider  diphenhydrAMINE (BENADRYL) 25 mg capsule Take 25 mg by mouth daily.   Yes Historical Provider, MD  Glucosamine HCl (GLUCOSAMINE PO) Take 4,000 mg by mouth 2 (two) times daily.    Yes Historical Provider, MD  LYSINE PO Take 1,000 mg by mouth daily.    Yes Historical Provider, MD  meloxicam (MOBIC) 15 MG tablet TAKE 1 TABLET BY MOUTH EVERY MORNING 07/06/15  Yes Raedyn Klinck Maceo Pro., MD  omeprazole (PRILOSEC) 20 MG capsule TAKE ONE CAPSULE BY MOUTH EVERY DAY Patient not taking: Reported on 04/06/2016 05/28/15   Jerrol Banana., MD    Patient Active Problem List   Diagnosis Date Noted  . Allergic rhinitis 04/17/2015  . Borderline diabetes 04/17/2015  . Adiposity 04/17/2015  . Plantar fasciitis 04/17/2015  . Peptic ulcer 04/17/2015  . HNP (herniated nucleus pulposus), lumbar 01/24/2015  . Gastritis 11/03/2011  . Abdominal pain, epigastric 10/29/2011  . DJD (degenerative joint disease), multiple sites 10/29/2011  . Dermatologic disease 03/30/2010  . Essential (primary)  hypertension 05/19/2009  . Esophagitis, reflux 09/03/2008    Past Medical History  Diagnosis Date  . Arthritis   . Complication of anesthesia     2005 had a 6-7 hr surgery, he had difficulty whenever he got up, would pass out.  heart tests were normal  "anesthesia" related he was told  . GERD (gastroesophageal reflux disease)   . Gastritis     last flare up was 3 yrs ago    Social History   Social History  . Marital Status: Married    Spouse Name: N/A  . Number of Children: N/A  . Years of Education: N/A   Occupational History  . [employer]    Social History Main Topics  . Smoking status: Never Smoker   . Smokeless tobacco: Never Used  . Alcohol Use: No  . Drug Use: No  . Sexual Activity: Not on file   Other Topics Concern  . Not on file   Social History Narrative    Allergies  Allergen Reactions  . Other     Pt stated he had a surgery on 10/19/2004, was under deep anaesthesia, did well waking up, but had continuous episodes of passing out when trying to stand.   . Betadine [Povidone Iodine] Hives and Rash    Blistered skin     Review of Systems  Constitutional: Positive for fever.  HENT: Negative.   Eyes: Negative.   Respiratory: Negative.   Cardiovascular: Negative.   Gastrointestinal: Negative.   Genitourinary: Positive for urgency, frequency and flank pain.  Musculoskeletal: Positive for back pain.  Skin: Negative.   Neurological: Negative.   Endo/Heme/Allergies: Negative.   Psychiatric/Behavioral: Negative.     Immunization History  Administered Date(s) Administered  . Influenza-Unspecified 09/12/2015  . Tdap 01/04/2011   Objective:  BP 112/72 mmHg  Pulse 86  Temp(Src) 98.3 F (36.8 C) (Oral)  Resp 16  Wt 273 lb (123.832 kg)  Physical Exam  Constitutional: He is oriented to person, place, and time and well-developed, well-nourished, and in no distress.  HENT:  Head: Normocephalic and atraumatic.  Right Ear: External ear normal.  Left  Ear: External ear normal.  Nose: Nose normal.  Eyes: Conjunctivae are normal. Pupils are equal, round, and reactive to light.  Neck: Normal range of motion. Neck supple.  Cardiovascular: Normal rate, regular rhythm, normal heart sounds and intact distal pulses.   Pulmonary/Chest: Effort normal and breath sounds normal.  Abdominal: Soft. Bowel sounds are normal. There is tenderness (LUQ tenderness).  Genitourinary:  Left CVA tenderness  Neurological: He is alert and oriented to person, place, and time. He has normal reflexes. Gait normal. GCS score is 15.  Skin: Skin is warm and dry.  Psychiatric: Mood, memory, affect and judgment normal.    Lab Results  Component Value Date   WBC 5.8 06/03/2015   HGB 15.1 01/22/2015   HCT 44.7 06/03/2015   PLT 184 06/03/2015   GLUCOSE 91 06/03/2015   CHOL 185 06/03/2015   TRIG 122 06/03/2015   HDL 48 06/03/2015   LDLCALC 113* 06/03/2015   TSH 2.750 06/03/2015   PSA 0.9 04/23/2014   INR 0.9 06/13/2009   HGBA1C 5.7* 10/14/2015    CMP     Component Value Date/Time   NA 142 06/03/2015 1017   NA 142 01/22/2015 1012   K 4.7 06/03/2015 1017   CL 102 06/03/2015 1017   CO2 26 06/03/2015 1017   GLUCOSE 91 06/03/2015 1017   GLUCOSE 102* 01/22/2015 1012   BUN 10 06/03/2015 1017   BUN 11 01/22/2015 1012   CREATININE 0.66* 06/03/2015 1017   CREATININE 0.9 04/23/2014   CALCIUM 9.0 06/03/2015 1017   PROT 6.7 06/03/2015 1017   ALBUMIN 4.3 06/03/2015 1017   AST 17 06/03/2015 1017   ALT 24 06/03/2015 1017   ALKPHOS 72 06/03/2015 1017   BILITOT 0.5 06/03/2015 1017   GFRNONAA 114 06/03/2015 1017   GFRAA 131 06/03/2015 1017    Assessment and Plan :  1. Flank pain  - POCT urinalysis dipstick--Trace positive for blood. - DG Abd 1 View; Future - HYDROcodone-acetaminophen (NORCO) 10-325 MG tablet; Take 1-2 tablets by mouth every 4 (four) hours as needed.  Dispense: 100 tablet; Refill: 0  2. LUQ abdominal pain   3. Kidney stone Possibly. Will  write out of work today and tomorrow and possibly the rest of the week. Refer to urology, concerned this could be causing an infection./obsttruction.   Patient was seen and examined by Dr. Miguel Aschoff, and noted scribed by Webb Laws, [place] MD [facility] 04/06/2016 1:45 PM

## 2016-04-07 ENCOUNTER — Telehealth: Payer: Self-pay

## 2016-04-07 LAB — COMPREHENSIVE METABOLIC PANEL
ALBUMIN: 4 g/dL (ref 3.5–5.5)
ALK PHOS: 74 IU/L (ref 39–117)
ALT: 19 IU/L (ref 0–44)
AST: 19 IU/L (ref 0–40)
Albumin/Globulin Ratio: 1.5 (ref 1.2–2.2)
BUN / CREAT RATIO: 9 (ref 9–20)
BUN: 15 mg/dL (ref 6–24)
Bilirubin Total: 0.6 mg/dL (ref 0.0–1.2)
CO2: 22 mmol/L (ref 18–29)
CREATININE: 1.7 mg/dL — AB (ref 0.76–1.27)
Calcium: 8.7 mg/dL (ref 8.7–10.2)
Chloride: 100 mmol/L (ref 96–106)
GFR, EST AFRICAN AMERICAN: 54 mL/min/{1.73_m2} — AB (ref >59)
GFR, EST NON AFRICAN AMERICAN: 46 mL/min/{1.73_m2} — AB (ref >59)
GLOBULIN, TOTAL: 2.6 g/dL (ref 1.5–4.5)
GLUCOSE: 85 mg/dL (ref 65–99)
Potassium: 4.7 mmol/L (ref 3.5–5.2)
SODIUM: 140 mmol/L (ref 134–144)
TOTAL PROTEIN: 6.6 g/dL (ref 6.0–8.5)

## 2016-04-07 LAB — CBC WITH DIFFERENTIAL/PLATELET
BASOS ABS: 0 10*3/uL (ref 0.0–0.2)
Basos: 0 %
EOS (ABSOLUTE): 0.2 10*3/uL (ref 0.0–0.4)
EOS: 2 %
HEMATOCRIT: 43 % (ref 37.5–51.0)
HEMOGLOBIN: 14.7 g/dL (ref 12.6–17.7)
IMMATURE GRANS (ABS): 0 10*3/uL (ref 0.0–0.1)
Immature Granulocytes: 0 %
LYMPHS ABS: 1.6 10*3/uL (ref 0.7–3.1)
LYMPHS: 15 %
MCH: 30.2 pg (ref 26.6–33.0)
MCHC: 34.2 g/dL (ref 31.5–35.7)
MCV: 88 fL (ref 79–97)
MONOCYTES: 13 %
Monocytes Absolute: 1.3 10*3/uL — ABNORMAL HIGH (ref 0.1–0.9)
NEUTROS ABS: 7.5 10*3/uL — AB (ref 1.4–7.0)
Neutrophils: 70 %
Platelets: 181 10*3/uL (ref 150–379)
RBC: 4.87 x10E6/uL (ref 4.14–5.80)
RDW: 14 % (ref 12.3–15.4)
WBC: 10.7 10*3/uL (ref 3.4–10.8)

## 2016-04-07 LAB — URINE CULTURE: Organism ID, Bacteria: NO GROWTH

## 2016-04-07 NOTE — Telephone Encounter (Signed)
-----   Message from Jerrol Banana., MD sent at 04/06/2016  3:09 PM EDT ----- X-ray normal.

## 2016-04-07 NOTE — Telephone Encounter (Signed)
Patient advised as directed below. Patient verbalized understanding.  

## 2016-04-07 NOTE — Telephone Encounter (Signed)
-----   Message from Jerrol Banana., MD sent at 04/07/2016  8:11 AM EDT ----- Labs okay except mildly decreased kidney function with fits with possible stone. Push fluids and keep follow-up for next week.

## 2016-04-12 ENCOUNTER — Encounter: Payer: Self-pay | Admitting: Family Medicine

## 2016-04-12 ENCOUNTER — Ambulatory Visit (INDEPENDENT_AMBULATORY_CARE_PROVIDER_SITE_OTHER): Payer: BLUE CROSS/BLUE SHIELD | Admitting: Family Medicine

## 2016-04-12 VITALS — BP 98/66 | HR 80 | Temp 98.2°F | Resp 12 | Wt 271.0 lb

## 2016-04-12 DIAGNOSIS — K219 Gastro-esophageal reflux disease without esophagitis: Secondary | ICD-10-CM

## 2016-04-12 DIAGNOSIS — N200 Calculus of kidney: Secondary | ICD-10-CM

## 2016-04-12 DIAGNOSIS — R739 Hyperglycemia, unspecified: Secondary | ICD-10-CM

## 2016-04-12 NOTE — Progress Notes (Signed)
Patient ID: Maxwell Brown, male   DOB: 10/07/1966, 50 y.o.   MRN: [id number]    Subjective:  HPI  Patient is here for follow up:  Kidney stones : Patient was seen on April 25th for flank pain and had KUB and labs done, he ended up passing 3 kidney stones and so far the pain he was having has resolved. His labs showed some mild decrease in kidney function. He is feeling much better.  GERD: He is on Zantac OTC now and symptoms are controlled so far, he use to be on Omeprazole.  Last lipids, TSH and A1C (11/03/15) were 06/03/15.  Prior to Admission medications   Medication Sig Start Date End Date Taking? Authorizing Provider  diphenhydrAMINE (BENADRYL) 25 mg capsule Take 25 mg by mouth daily.   Yes Historical Provider, MD  Glucosamine HCl (GLUCOSAMINE PO) Take 4,000 mg by mouth 2 (two) times daily.    Yes Historical Provider, MD  HYDROcodone-acetaminophen (NORCO) 10-325 MG tablet Take 1-2 tablets by mouth every 4 (four) hours as needed. 04/06/16  Yes Richard Maceo Pro., MD  LYSINE PO Take 1,000 mg by mouth daily.    Yes Historical Provider, MD  meloxicam (MOBIC) 15 MG tablet TAKE 1 TABLET BY MOUTH EVERY MORNING 07/06/15  Yes Richard Maceo Pro., MD  promethazine (PHENERGAN) 25 MG tablet Take 1 tablet (25 mg total) by mouth every 6 (six) hours as needed for nausea or vomiting. 04/06/16  Yes Jerrol Banana., MD  RaNITidine HCl (ZANTAC 75 PO) Take 1 tablet by mouth daily. Not sure of the mg dose   Yes Historical Provider, MD    Patient Active Problem List   Diagnosis Date Noted  . Allergic rhinitis 04/17/2015  . Borderline diabetes 04/17/2015  . Adiposity 04/17/2015  . Plantar fasciitis 04/17/2015  . Peptic ulcer 04/17/2015  . HNP (herniated nucleus pulposus), lumbar 01/24/2015  . Gastritis 11/03/2011  . Abdominal pain, epigastric 10/29/2011  . DJD (degenerative joint disease), multiple sites 10/29/2011  . Dermatologic disease 03/30/2010  . Essential (primary) hypertension  05/19/2009  . Esophagitis, reflux 09/03/2008    Past Medical History  Diagnosis Date  . Arthritis   . Complication of anesthesia     2005 had a 6-7 hr surgery, he had difficulty whenever he got up, would pass out.  heart tests were normal  "anesthesia" related he was told  . GERD (gastroesophageal reflux disease)   . Gastritis     last flare up was 3 yrs ago    Social History   Social History  . Marital Status: Married    Spouse Name: N/A  . Number of Children: N/A  . Years of Education: N/A   Occupational History  . [employer]    Social History Main Topics  . Smoking status: Never Smoker   . Smokeless tobacco: Never Used  . Alcohol Use: No  . Drug Use: No  . Sexual Activity: Not on file   Other Topics Concern  . Not on file   Social History Narrative    Allergies  Allergen Reactions  . Other     Pt stated he had a surgery on 10/19/2004, was under deep anaesthesia, did well waking up, but had continuous episodes of passing out when trying to stand.   . Betadine [Povidone Iodine] Hives and Rash    Blistered skin     Review of Systems  Constitutional: Negative.   Respiratory: Negative.   Cardiovascular: Negative.   Gastrointestinal: Negative.  Musculoskeletal: Negative.     Immunization History  Administered Date(s) Administered  . Influenza-Unspecified 09/12/2015  . Tdap 01/04/2011   Objective:  BP 98/66 mmHg  Pulse 80  Temp(Src) 98.2 F (36.8 C)  Resp 12  Wt 271 lb (122.925 kg)  Physical Exam  Constitutional: He is oriented to person, place, and time and well-developed, well-nourished, and in no distress.  HENT:  Head: Normocephalic and atraumatic.  Eyes: Conjunctivae are normal. Pupils are equal, round, and reactive to light.  Neck: Normal range of motion. Neck supple.  Cardiovascular: Normal rate, regular rhythm, normal heart sounds and intact distal pulses.   No murmur heard. Pulmonary/Chest: Effort normal and breath sounds normal. No  respiratory distress. He has no wheezes.  Abdominal: Soft. There is no tenderness. There is no rebound.  Neurological: He is alert and oriented to person, place, and time.    Lab Results  Component Value Date   WBC 10.7 04/06/2016   HGB 15.1 01/22/2015   HCT 43.0 04/06/2016   PLT 181 04/06/2016   GLUCOSE 85 04/06/2016   CHOL 185 06/03/2015   TRIG 122 06/03/2015   HDL 48 06/03/2015   LDLCALC 113* 06/03/2015   TSH 2.750 06/03/2015   PSA 0.9 04/23/2014   INR 0.9 06/13/2009   HGBA1C 5.7* 10/14/2015    CMP     Component Value Date/Time   NA 140 04/06/2016 1653   NA 142 01/22/2015 1012   K 4.7 04/06/2016 1653   CL 100 04/06/2016 1653   CO2 22 04/06/2016 1653   GLUCOSE 85 04/06/2016 1653   GLUCOSE 102* 01/22/2015 1012   BUN 15 04/06/2016 1653   BUN 11 01/22/2015 1012   CREATININE 1.70* 04/06/2016 1653   CREATININE 0.9 04/23/2014   CALCIUM 8.7 04/06/2016 1653   PROT 6.6 04/06/2016 1653   ALBUMIN 4.0 04/06/2016 1653   AST 19 04/06/2016 1653   ALT 19 04/06/2016 1653   ALKPHOS 74 04/06/2016 1653   BILITOT 0.6 04/06/2016 1653   GFRNONAA 46* 04/06/2016 1653   GFRAA 54* 04/06/2016 1653    Assessment and Plan :  1. Kidney stone Passed 3. Pain resolved so far. Advised patient he can cancel appointment with specialist, follow as needed. ADvised he can try to drink cranberry juice with some possible benefit. He is advised to continue to push fluids.  2. Gastroesophageal reflux disease, esophagitis presence not specified Stable.  3. Hyperglycemia/prediabetes I have done the exam and reviewed the above chart and it is accurate to the best of my knowledge.  Patient was seen and examined by Dr. Eulas Post and note was scribed by Theressa Millard, RMA.    Miguel Aschoff MD [facility] 04/12/2016 8:10 AM

## 2016-04-19 ENCOUNTER — Ambulatory Visit: Payer: BLUE CROSS/BLUE SHIELD

## 2016-06-30 ENCOUNTER — Other Ambulatory Visit: Payer: Self-pay | Admitting: Family Medicine

## 2016-07-19 ENCOUNTER — Encounter: Payer: Self-pay | Admitting: Family Medicine

## 2016-07-19 ENCOUNTER — Ambulatory Visit (INDEPENDENT_AMBULATORY_CARE_PROVIDER_SITE_OTHER): Payer: BLUE CROSS/BLUE SHIELD | Admitting: Family Medicine

## 2016-07-19 VITALS — BP 120/74 | HR 68 | Temp 98.5°F | Resp 16 | Wt 274.0 lb

## 2016-07-19 DIAGNOSIS — Z125 Encounter for screening for malignant neoplasm of prostate: Secondary | ICD-10-CM

## 2016-07-19 DIAGNOSIS — Z0000 Encounter for general adult medical examination without abnormal findings: Secondary | ICD-10-CM | POA: Diagnosis not present

## 2016-07-19 DIAGNOSIS — E785 Hyperlipidemia, unspecified: Secondary | ICD-10-CM | POA: Diagnosis not present

## 2016-07-19 DIAGNOSIS — R109 Unspecified abdominal pain: Secondary | ICD-10-CM | POA: Diagnosis not present

## 2016-07-19 LAB — POCT URINALYSIS DIPSTICK
BILIRUBIN UA: NEGATIVE
Glucose, UA: NEGATIVE
KETONES UA: NEGATIVE
Leukocytes, UA: NEGATIVE
Nitrite, UA: NEGATIVE
PH UA: 6.5
PROTEIN UA: NEGATIVE
RBC UA: NEGATIVE
Urobilinogen, UA: NORMAL

## 2016-07-19 MED ORDER — METAXALONE 800 MG PO TABS: 800.0000 mg | ORAL_TABLET | Freq: Three times a day (TID) | ORAL | 5 refills | Status: DC

## 2016-07-19 MED ORDER — HYDROCODONE-ACETAMINOPHEN 10-325 MG PO TABS
1.0000 | ORAL_TABLET | ORAL | 0 refills | Status: DC | PRN
Start: 2016-07-19 — End: 2016-09-20

## 2016-07-19 NOTE — Progress Notes (Signed)
Patient: Maxwell Brown, Male    DOB: 07-Jul-1966, 50 y.o.   MRN: [id number] Visit Date: 07/19/2016  Today's Provider: [name], MD   Chief Complaint  Patient presents with  . Annual Exam   Subjective:    Annual physical exam Maxwell Brown is a 50 y.o. male who presents today for health maintenance and complete physical. He feels well. He reports exercising occasionally. He reports he is sleeping fairly well. He sleeps on average 6-7 hours.     Review of Systems  Constitutional: Positive for fatigue. Negative for activity change, appetite change, chills, diaphoresis, fever and unexpected weight change.  HENT: Negative.   Eyes: Negative.   Respiratory: Negative.   Cardiovascular: Negative.   Gastrointestinal: Negative.   Endocrine: Negative.   Genitourinary: Negative.   Musculoskeletal: Positive for arthralgias, back pain, joint swelling and neck stiffness. Negative for gait problem, myalgias and neck pain.  Skin: Negative.   Allergic/Immunologic: Negative.   Neurological: Negative.   Hematological: Negative.   Psychiatric/Behavioral: Negative.     Social History      He  reports that he has never smoked. He has never used smokeless tobacco. He reports that he does not drink alcohol or use drugs.       Social History   Social History  . Marital status: Married    Spouse name: N/A  . Number of children: N/A  . Years of education: N/A   Occupational History  . [employer]    Social History Main Topics  . Smoking status: Never Smoker  . Smokeless tobacco: Never Used  . Alcohol use No  . Drug use: No  . Sexual activity: Not Asked   Other Topics Concern  . None   Social History Narrative  . None    Past Medical History:  Diagnosis Date  . Arthritis   . Complication of anesthesia    2005 had a 6-7 hr surgery, he had difficulty whenever he got up, would pass out.  heart tests were normal  "anesthesia" related he was told  . Gastritis    last flare up was 3 yrs ago  . GERD (gastroesophageal reflux disease)      Patient Active Problem List   Diagnosis Date Noted  . Allergic rhinitis 04/17/2015  . Borderline diabetes 04/17/2015  . Adiposity 04/17/2015  . Plantar fasciitis 04/17/2015  . Peptic ulcer 04/17/2015  . HNP (herniated nucleus pulposus), lumbar 01/24/2015  . Gastritis 11/03/2011  . Abdominal pain, epigastric 10/29/2011  . DJD (degenerative joint disease), multiple sites 10/29/2011  . Dermatologic disease 03/30/2010  . Essential (primary) hypertension 05/19/2009  . Esophagitis, reflux 09/03/2008    Past Surgical History:  Procedure Laterality Date  . BACK SURGERY    . NECK SURGERY      Family History        Family Status  Relation Status  . Mother Alive  . Father Deceased  . Brother Deceased  . Brother Alive  . Brother Alive  . Neg Hx         His family history includes Bone cancer in his father; Diabetes in his mother; Heart disease in his mother; Lung cancer in his father; Transient ischemic attack in his mother.    Allergies  Allergen Reactions  . Other     Pt stated he had a surgery on 10/19/2004, was under deep anaesthesia, did well waking up, but had continuous episodes of passing out when trying to stand.   Marland Kitchen  Betadine [Povidone Iodine] Hives and Rash    Blistered skin     Current Meds  Medication Sig  . Glucosamine HCl (GLUCOSAMINE PO) Take 4,000 mg by mouth 2 (two) times daily.   Marland Kitchen HYDROcodone-acetaminophen (NORCO) 10-325 MG tablet Take 1-2 tablets by mouth every 4 (four) hours as needed.  Marland Kitchen LYSINE PO Take 1,000 mg by mouth daily.   . meloxicam (MOBIC) 15 MG tablet TAKE 1 TABLET BY MOUTH IN THE MORNING  . RaNITidine HCl (ZANTAC 75 PO) Take 1 tablet by mouth daily. Not sure of the mg dose    Patient Care Team: Jerrol Banana., MD as PCP - General (Unknown Physician Specialty)     Objective:   Vitals: BP 120/74 (BP Location: Right Arm, Patient Position: Sitting, Cuff  Size: Normal)   Pulse 68   Temp 98.5 F (36.9 C)   Resp 16   Wt 274 lb (124.3 kg)   BMI 39.31 kg/m    Physical Exam  Constitutional: He is oriented to person, place, and time. He appears well-developed and well-nourished.  Cooperative, pleasant, obese white male in no acute distress.  HENT:  Head: Normocephalic and atraumatic.  Right Ear: External ear normal.  Left Ear: External ear normal.  Nose: Nose normal.  Mouth/Throat: Oropharynx is clear and moist.  Eyes: Conjunctivae are normal. Pupils are equal, round, and reactive to light.  Neck: Neck supple. No thyromegaly present.  Cardiovascular: Normal rate, regular rhythm, normal heart sounds and intact distal pulses.   Pulmonary/Chest: Effort normal and breath sounds normal.  Abdominal: Soft.  Lymphadenopathy:    He has no cervical adenopathy.  Neurological: He is alert and oriented to person, place, and time. He exhibits normal muscle tone. Coordination normal.  Skin: Skin is warm and dry.  Psychiatric: He has a normal mood and affect. His behavior is normal. Judgment and thought content normal.       Assessment & Plan:     Routine Health Maintenance and Physical Exam  Exercise Activities and Dietary recommendations Goals    None      Immunization History  Administered Date(s) Administered  . Influenza-Unspecified 09/12/2015  . Tdap 01/04/2011    Health Maintenance  Topic Date Due  . HIV Screening  05/01/1981  . COLONOSCOPY  05/01/2016  . INFLUENZA VACCINE  07/13/2016  . TETANUS/TDAP  01/04/2021     DRE deferred as patient needs his screening colonoscopy as he is 48 now. Patient wishes to be referred to Dr. Carlean Purl with [facility]. Discussed health benefits of physical activity, and encouraged him to engage in regular exercise appropriate for his age and condition.  Chronic back pain Patient status post cervical disc surgery and LS spine surgery Prediabetes Morbid obesity Right tennis  elbow Hydrocodone refilled, #50 tablets for chronic back pain which he uses infrequently. Skelaxin refilled also. Have discussed diet and exercise at length. Especially dietary changes for weight loss. I have done the exam and reviewed the above chart and it is accurate to the best of my knowledge.      Anevay Campanella Cranford Mon, MD  [facility]

## 2016-07-20 ENCOUNTER — Encounter: Payer: Self-pay | Admitting: Internal Medicine

## 2016-07-20 LAB — COMPREHENSIVE METABOLIC PANEL
ALBUMIN: 4.1 g/dL (ref 3.5–5.5)
ALK PHOS: 65 IU/L (ref 39–117)
ALT: 16 IU/L (ref 0–44)
AST: 17 IU/L (ref 0–40)
Albumin/Globulin Ratio: 1.9 (ref 1.2–2.2)
BILIRUBIN TOTAL: 0.4 mg/dL (ref 0.0–1.2)
BUN/Creatinine Ratio: 14 (ref 9–20)
BUN: 11 mg/dL (ref 6–24)
CO2: 25 mmol/L (ref 18–29)
CREATININE: 0.77 mg/dL (ref 0.76–1.27)
Calcium: 9 mg/dL (ref 8.7–10.2)
Chloride: 106 mmol/L (ref 96–106)
GFR calc Af Amer: 122 mL/min/{1.73_m2} (ref >59)
GFR calc non Af Amer: 106 mL/min/{1.73_m2} (ref >59)
GLUCOSE: 92 mg/dL (ref 65–99)
Globulin, Total: 2.2 g/dL (ref 1.5–4.5)
Potassium: 4.6 mmol/L (ref 3.5–5.2)
Sodium: 144 mmol/L (ref 134–144)
TOTAL PROTEIN: 6.3 g/dL (ref 6.0–8.5)

## 2016-07-20 LAB — CBC WITH DIFFERENTIAL/PLATELET
BASOS ABS: 0 10*3/uL (ref 0.0–0.2)
Basos: 0 %
EOS (ABSOLUTE): 0.3 10*3/uL (ref 0.0–0.4)
Eos: 5 %
HEMOGLOBIN: 15 g/dL (ref 12.6–17.7)
Hematocrit: 44.8 % (ref 37.5–51.0)
IMMATURE GRANS (ABS): 0 10*3/uL (ref 0.0–0.1)
Immature Granulocytes: 0 %
LYMPHS: 34 %
Lymphocytes Absolute: 2.4 10*3/uL (ref 0.7–3.1)
MCH: 30.1 pg (ref 26.6–33.0)
MCHC: 33.5 g/dL (ref 31.5–35.7)
MCV: 90 fL (ref 79–97)
MONOCYTES: 9 %
Monocytes Absolute: 0.6 10*3/uL (ref 0.1–0.9)
NEUTROS ABS: 3.6 10*3/uL (ref 1.4–7.0)
Neutrophils: 52 %
PLATELETS: 193 10*3/uL (ref 150–379)
RBC: 4.98 x10E6/uL (ref 4.14–5.80)
RDW: 14.8 % (ref 12.3–15.4)
WBC: 7 10*3/uL (ref 3.4–10.8)

## 2016-07-20 LAB — LIPID PANEL
CHOL/HDL RATIO: 4.1 ratio (ref 0.0–5.0)
CHOLESTEROL TOTAL: 168 mg/dL (ref 100–199)
HDL: 41 mg/dL (ref >39)
LDL CALC: 101 mg/dL — AB (ref 0–99)
TRIGLYCERIDES: 132 mg/dL (ref 0–149)
VLDL Cholesterol Cal: 26 mg/dL (ref 5–40)

## 2016-07-20 LAB — PSA: Prostate Specific Ag, Serum: 0.9 ng/mL (ref 0.0–4.0)

## 2016-07-20 LAB — TSH: TSH: 2.8 u[IU]/mL (ref 0.450–4.500)

## 2016-09-20 ENCOUNTER — Ambulatory Visit (AMBULATORY_SURGERY_CENTER): Payer: Self-pay | Admitting: *Deleted

## 2016-09-20 VITALS — Ht 70.0 in | Wt 272.0 lb

## 2016-09-20 DIAGNOSIS — Z1211 Encounter for screening for malignant neoplasm of colon: Secondary | ICD-10-CM

## 2016-09-20 NOTE — Progress Notes (Signed)
No egg or soy allergy. No anesthesia problems. Only problem was when pt had back surgery, pt would try to stand and pass out, but has had propofol before for endo and did fine.  No home O2.  No diet meds

## 2016-09-23 ENCOUNTER — Encounter: Payer: Self-pay | Admitting: Internal Medicine

## 2016-10-04 ENCOUNTER — Encounter: Payer: Self-pay | Admitting: Internal Medicine

## 2016-10-04 ENCOUNTER — Ambulatory Visit (AMBULATORY_SURGERY_CENTER): Payer: BLUE CROSS/BLUE SHIELD | Admitting: Internal Medicine

## 2016-10-04 VITALS — BP 152/58 | HR 67 | Resp 21 | Ht 70.0 in | Wt 272.0 lb

## 2016-10-04 DIAGNOSIS — D126 Benign neoplasm of colon, unspecified: Secondary | ICD-10-CM | POA: Diagnosis not present

## 2016-10-04 DIAGNOSIS — K635 Polyp of colon: Secondary | ICD-10-CM

## 2016-10-04 DIAGNOSIS — Z1211 Encounter for screening for malignant neoplasm of colon: Secondary | ICD-10-CM

## 2016-10-04 DIAGNOSIS — D120 Benign neoplasm of cecum: Secondary | ICD-10-CM

## 2016-10-04 DIAGNOSIS — Z1212 Encounter for screening for malignant neoplasm of rectum: Secondary | ICD-10-CM

## 2016-10-04 DIAGNOSIS — D123 Benign neoplasm of transverse colon: Secondary | ICD-10-CM | POA: Diagnosis not present

## 2016-10-04 DIAGNOSIS — D125 Benign neoplasm of sigmoid colon: Secondary | ICD-10-CM

## 2016-10-04 MED ORDER — SODIUM CHLORIDE 0.9 % IV SOLN: 500.0000 mL | INTRAVENOUS | Status: DC

## 2016-10-04 NOTE — Progress Notes (Signed)
A/ox3 pleased with MAC, report to Celia RN 

## 2016-10-04 NOTE — Progress Notes (Signed)
Called to room to assist during endoscopic procedure.  Patient ID and intended procedure confirmed with present staff. Received instructions for my participation in the procedure from the performing physician.  

## 2016-10-04 NOTE — Patient Instructions (Addendum)
   I found and removed 3 very small polyps. I will let you know pathology results and when to have another routine colonoscopy by mail.  I appreciate the opportunity to care for you. Gatha Mayer, MD, [facility]  Discharge instructions given. Handout on polyps. Resume previous medications. YOU HAD AN ENDOSCOPIC PROCEDURE TODAY AT [facility]:   Refer to the procedure report that was given to you for any specific questions about what was found during the examination.  If the procedure report does not answer your questions, please call your gastroenterologist to clarify.  If you requested that your care partner not be given the details of your procedure findings, then the procedure report has been included in a sealed envelope for you to review at your convenience later.  YOU SHOULD EXPECT: Some feelings of bloating in the abdomen. Passage of more gas than usual.  Walking can help get rid of the air that was put into your GI tract during the procedure and reduce the bloating. If you had a lower endoscopy (such as a colonoscopy or flexible sigmoidoscopy) you may notice spotting of blood in your stool or on the toilet paper. If you underwent a bowel prep for your procedure, you may not have a normal bowel movement for a few days.  Please Note:  You might notice some irritation and congestion in your nose or some drainage.  This is from the oxygen used during your procedure.  There is no need for concern and it should clear up in a day or so.  SYMPTOMS TO REPORT IMMEDIATELY:   Following lower endoscopy (colonoscopy or flexible sigmoidoscopy):  Excessive amounts of blood in the stool  Significant tenderness or worsening of abdominal pains  Swelling of the abdomen that is new, acute  Fever of 100F or higher   For urgent or emergent issues, a gastroenterologist can be reached at any hour by calling [phone].   DIET:  We do recommend a small meal at first, but then you  may proceed to your regular diet.  Drink plenty of fluids but you should avoid alcoholic beverages for 24 hours.  ACTIVITY:  You should plan to take it easy for the rest of today and you should NOT DRIVE or use heavy machinery until tomorrow (because of the sedation medicines used during the test).    FOLLOW UP: Our staff will call the number listed on your records the next business day following your procedure to check on you and address any questions or concerns that you may have regarding the information given to you following your procedure. If we do not reach you, we will leave a message.  However, if you are feeling well and you are not experiencing any problems, there is no need to return our call.  We will assume that you have returned to your regular daily activities without incident.  If any biopsies were taken you will be contacted by phone or by letter within the next 1-3 weeks.  Please call us at [phone] if you have not heard about the biopsies in 3 weeks.    SIGNATURES/CONFIDENTIALITY: You and/or your care partner have signed paperwork which will be entered into your electronic medical record.  These signatures attest to the fact that that the information above on your After Visit Summary has been reviewed and is understood.  Full responsibility of the confidentiality of this discharge information lies with you and/or your care-partner.

## 2016-10-04 NOTE — Op Note (Signed)
[place] Patient Name: Maxwell Brown Procedure Date: 10/04/2016 11:46 AM MRN: [id number] Endoscopist: Gatha Mayer , MD Age: 50 Referring MD:  Date of Birth: 1966-01-02 Gender: Male Account #: [id number] Procedure:                Colonoscopy Indications:              Screening for colorectal malignant neoplasm, This                            is the patient's first colonoscopy Medicines:                Propofol per Anesthesia, Monitored Anesthesia Care Procedure:                Pre-Anesthesia Assessment:                           - Prior to the procedure, a History and Physical                            was performed, and patient medications and                            allergies were reviewed. The patient's tolerance of                            previous anesthesia was also reviewed. The risks                            and benefits of the procedure and the sedation                            options and risks were discussed with the patient.                            All questions were answered, and informed consent                            was obtained. Prior Anticoagulants: The patient has                            taken no previous anticoagulant or antiplatelet                            agents. ASA Grade Assessment: II - A patient with                            mild systemic disease. After reviewing the risks                            and benefits, the patient was deemed in                            satisfactory condition to undergo the procedure.  After obtaining informed consent, the colonoscope                            was passed under direct vision. Throughout the                            procedure, the patient's blood pressure, pulse, and                            oxygen saturations were monitored continuously. The                            Model CF-HQ190L [id number]) scope was introduced   through the anus and advanced to the the cecum,                            identified by appendiceal orifice and ileocecal                            valve. The colonoscopy was performed without                            difficulty. The patient tolerated the procedure                            well. The quality of the bowel preparation was                            good. The bowel preparation used was Miralax. The                            ileocecal valve, appendiceal orifice, and rectum                            were photographed. Scope In: 11:57:11 AM Scope Out: 12:11:49 PM Scope Withdrawal Time: 0 hours 12 minutes 37 seconds  Total Procedure Duration: 0 hours 14 minutes 38 seconds  Findings:                 The perianal and digital rectal examinations were                            normal. Pertinent negatives include normal prostate                            (size, shape, and consistency).                           Two sessile polyps were found in the sigmoid colon                            and cecum. The polyps were 2 mm in size. These                            polyps were removed  with a cold biopsy forceps.                            Resection and retrieval were complete. Verification                            of patient identification for the specimen was                            done. Estimated blood loss was minimal.                           A 5 mm polyp was found in the transverse colon. The                            polyp was sessile. The polyp was removed with a                            cold snare. Resection and retrieval were complete.                            Verification of patient identification for the                            specimen was done. Estimated blood loss was minimal.                           The exam was otherwise without abnormality on                            direct and retroflexion views. Complications:            No immediate  complications. Estimated Blood Loss:     Estimated blood loss was minimal. Impression:               - Two 2 mm polyps in the sigmoid colon and in the                            cecum, removed with a cold biopsy forceps. Resected                            and retrieved.                           - One 5 mm polyp in the transverse colon, removed                            with a cold snare. Resected and retrieved.                           - The examination was otherwise normal on direct                            and retroflexion views. Recommendation:           -  Patient has a contact number available for                            emergencies. The signs and symptoms of potential                            delayed complications were discussed with the                            patient. Return to normal activities tomorrow.                            Written discharge instructions were provided to the                            patient.                           - Resume previous diet.                           - Continue present medications.                           - Repeat colonoscopy is recommended. The                            colonoscopy date will be determined after pathology                            results from today's exam become available for                            review. Gatha Mayer, MD 10/04/2016 12:18:07 PM This report has been signed electronically.

## 2016-10-05 ENCOUNTER — Telehealth: Payer: Self-pay | Admitting: *Deleted

## 2016-10-05 NOTE — Telephone Encounter (Signed)
  Follow up Call-  Call back number 10/04/2016  Post procedure Call Back phone  # [phone]  Permission to leave phone message Yes  Some recent data might be hidden     Patient questions:  Do you have a fever, pain , or abdominal swelling? No. Pain Score  0 *  Have you tolerated food without any problems? Yes.    Have you been able to return to your normal activities? Yes.    Do you have any questions about your discharge instructions: Diet   No. Medications  No. Follow up visit  No.  Do you have questions or concerns about your Care? No.  Actions: * If pain score is 4 or above: No action needed, pain <4.

## 2016-10-07 ENCOUNTER — Encounter: Payer: Self-pay | Admitting: Internal Medicine

## 2016-10-07 DIAGNOSIS — Z86010 Personal history of colonic polyps: Secondary | ICD-10-CM

## 2016-10-07 DIAGNOSIS — Z860101 Personal history of adenomatous and serrated colon polyps: Secondary | ICD-10-CM | POA: Insufficient documentation

## 2016-10-07 HISTORY — DX: Personal history of colonic polyps: Z86.010

## 2016-10-07 HISTORY — DX: Personal history of adenomatous and serrated colon polyps: Z86.0101

## 2016-10-07 NOTE — Progress Notes (Signed)
2 small adenomas \\Recall  colonoscopy 2022

## 2016-11-12 ENCOUNTER — Encounter: Payer: Self-pay | Admitting: Family Medicine

## 2016-11-12 ENCOUNTER — Ambulatory Visit (INDEPENDENT_AMBULATORY_CARE_PROVIDER_SITE_OTHER): Payer: BLUE CROSS/BLUE SHIELD | Admitting: Family Medicine

## 2016-11-12 VITALS — BP 116/72 | HR 102 | Temp 98.1°F | Resp 18 | Wt 270.0 lb

## 2016-11-12 DIAGNOSIS — J069 Acute upper respiratory infection, unspecified: Secondary | ICD-10-CM | POA: Diagnosis not present

## 2016-11-12 DIAGNOSIS — K2100 Gastro-esophageal reflux disease with esophagitis, without bleeding: Secondary | ICD-10-CM

## 2016-11-12 DIAGNOSIS — B9789 Other viral agents as the cause of diseases classified elsewhere: Secondary | ICD-10-CM

## 2016-11-12 MED ORDER — SUCRALFATE 1 G PO TABS: 1.0000 g | ORAL_TABLET | Freq: Three times a day (TID) | ORAL | 0 refills | Status: DC

## 2016-11-12 NOTE — Progress Notes (Signed)
Subjective:     Patient ID: Maxwell Brown, male   DOB: [birth year], 50 y.o.   MRN: [id number]  HPI  Chief Complaint  Patient presents with  . URI    for about a week and a half. Sinus congestion and post nasal drainage. Fatigue. Denies fevers, chills, or sweats  . Dysphagia    " I feel like there is a ball of cotton sitting right here in my throat" Start in the last 3-4 days suddenly. Trouble swallowing food  States sinus drainage is now clear and he is turning the corner. Remains on Zantac 75 mg daily; uses two pillows in his bed at night. States food will "hang up" and he feels his esophagus spasming.   Review of Systems     Objective:   Physical Exam  Constitutional: He appears well-developed and well-nourished. No distress.  Ears: T.M's intact without inflammation Throat: no tonsillar enlargement or exudate Neck: no cervical adenopathy Lungs: clear Abdomen: soft, non-tender     Assessment:    1. Viral upper respiratory tract infection; spontaneously improving  2. Esophagitis, reflux - sucralfate (CARAFATE) 1 g tablet; Take 1 tablet (1 g total) by mouth 4 (four) times daily -  with meals and at bedtime.  Dispense: 28 tablet; Refill: 0    Plan:    Discussed increasing Zantac to 150 bid for two weeks and HOB elevation to 6 inches. Consider G.I. Referral if not improving.

## 2016-11-12 NOTE — Patient Instructions (Signed)
Increase Zantac to 150 mg.twice daily for two weeks. If better reduce to 75 mg twice.daily for a week then 75 mg.once daily. Consider elevating the legs at the head of your bed 6 inches and only use one pillow.

## 2016-12-07 ENCOUNTER — Ambulatory Visit (INDEPENDENT_AMBULATORY_CARE_PROVIDER_SITE_OTHER): Payer: BLUE CROSS/BLUE SHIELD | Admitting: Family Medicine

## 2016-12-07 ENCOUNTER — Encounter: Payer: Self-pay | Admitting: Family Medicine

## 2016-12-07 VITALS — BP 108/84 | HR 81 | Temp 97.9°F | Resp 16 | Wt 272.4 lb

## 2016-12-07 DIAGNOSIS — J40 Bronchitis, not specified as acute or chronic: Secondary | ICD-10-CM | POA: Diagnosis not present

## 2016-12-07 MED ORDER — CEFDINIR 300 MG PO CAPS: 300.0000 mg | ORAL_CAPSULE | Freq: Two times a day (BID) | ORAL | 0 refills | Status: DC

## 2016-12-07 NOTE — Progress Notes (Signed)
Subjective:     Patient ID: Maxwell Brown, male   DOB: 04/12/1966, 50 y.o.   MRN: [id number]  HPI  Chief Complaint  Patient presents with  . Sore Throat    Patient comes in office today with complaints of sore throat, cough and congestion for the past month. Patient reports trouble swallowing, cough productive of green phlegm and brochial irritation. Patient states that he has taken otc Aleve and Tylenol.   States reflux got better from last office visit but cough has persisted especially in the AM. Accommpanied by his wife today.   Review of Systems     Objective:   Physical Exam  Constitutional: He appears well-developed and well-nourished. No distress.  Ears: T.M's intact without inflammation Throat: no tonsillar enlargement or exudate, mild posterior pharyngeal erythema Neck: no cervical adenopathy Lungs: clear     Assessment:    1. Bronchitis - cefdinir (OMNICEF) 300 MG capsule; Take 1 capsule (300 mg total) by mouth 2 (two) times daily.  Dispense: 14 capsule; Refill: 0    Plan:    Discussed use of expectorants.

## 2016-12-07 NOTE — Patient Instructions (Signed)
Add expectorant like Mucinex.

## 2016-12-30 ENCOUNTER — Other Ambulatory Visit: Payer: Self-pay | Admitting: Family Medicine

## 2017-01-01 ENCOUNTER — Other Ambulatory Visit: Payer: Self-pay | Admitting: Family Medicine

## 2017-01-18 ENCOUNTER — Ambulatory Visit (INDEPENDENT_AMBULATORY_CARE_PROVIDER_SITE_OTHER): Payer: BLUE CROSS/BLUE SHIELD | Admitting: Family Medicine

## 2017-01-18 VITALS — BP 116/72 | HR 72 | Temp 98.4°F | Resp 16 | Wt 272.0 lb

## 2017-01-18 DIAGNOSIS — B001 Herpesviral vesicular dermatitis: Secondary | ICD-10-CM

## 2017-01-18 DIAGNOSIS — J0100 Acute maxillary sinusitis, unspecified: Secondary | ICD-10-CM

## 2017-01-18 MED ORDER — VALACYCLOVIR HCL 500 MG PO TABS: 500.0000 mg | ORAL_TABLET | Freq: Two times a day (BID) | ORAL | 0 refills | Status: DC

## 2017-01-18 MED ORDER — AMOXICILLIN-POT CLAVULANATE 875-125 MG PO TABS: 1.0000 | ORAL_TABLET | Freq: Two times a day (BID) | ORAL | 0 refills | Status: DC

## 2017-01-18 NOTE — Progress Notes (Signed)
Maxwell Brown  MRN: [id number] DOB: 14-Apr-1966  Subjective:  HPI  Patient states he started not feeling well last Thursday 01/13/17. Symptoms are sinus pain, pressure, head pressure, congestion, blowing out green stuff from his nose. No fever, no chills.  Patient Active Problem List   Diagnosis Date Noted  . History of adenomatous polyps of colon 10/07/2016  . Allergic rhinitis 04/17/2015  . Borderline diabetes 04/17/2015  . Adiposity 04/17/2015  . Plantar fasciitis 04/17/2015  . Peptic ulcer 04/17/2015  . HNP (herniated nucleus pulposus), lumbar 01/24/2015  . Gastritis 11/03/2011  . Abdominal pain, epigastric 10/29/2011  . DJD (degenerative joint disease), multiple sites 10/29/2011  . Dermatologic disease 03/30/2010  . Essential (primary) hypertension 05/19/2009  . Esophagitis, reflux 09/03/2008    Past Medical History:  Diagnosis Date  . Arthritis   . Complication of anesthesia    2005 had a 6-7 hr surgery, he had difficulty whenever he got up, would pass out.  heart tests were normal  "anesthesia" related he was told  . Gastritis    last flare up was 3 yrs ago  . GERD (gastroesophageal reflux disease)   . History of adenomatous polyps of colon 10/07/2016    Social History   Social History  . Marital status: Married    Spouse name: N/A  . Number of children: N/A  . Years of education: N/A   Occupational History  . [employer]    Social History Main Topics  . Smoking status: Never Smoker  . Smokeless tobacco: Never Used  . Alcohol use No  . Drug use: No  . Sexual activity: Not on file   Other Topics Concern  . Not on file   Social History Narrative  . No narrative on file    Outpatient Encounter Prescriptions as of 01/18/2017  Medication Sig Note  . diphenhydrAMINE (BENADRYL) 25 mg capsule Take 25 mg by mouth daily.   Marland Kitchen FLUARIX QUADRIVALENT 0.5 ML injection inject 0.5 milliliter intramuscularly 11/12/2016: Received from: External Pharmacy  . Glucosamine  HCl (GLUCOSAMINE PO) Take 4,000 mg by mouth 2 (two) times daily.    Marland Kitchen LYSINE PO Take 1,000 mg by mouth daily.    . meloxicam (MOBIC) 15 MG tablet TAKE 1 TABLET BY MOUTH IN THE MORNING   . metaxalone (SKELAXIN) 800 MG tablet Take 1 tablet (800 mg total) by mouth 3 (three) times daily.   . RaNITidine HCl (ZANTAC 75 PO) Take 1 tablet by mouth 2 (two) times daily. Not sure of the mg dose  11/12/2016: " off and on"  . [DISCONTINUED] cefdinir (OMNICEF) 300 MG capsule Take 1 capsule (300 mg total) by mouth 2 (two) times daily.   . [DISCONTINUED] meloxicam (MOBIC) 15 MG tablet TAKE 1 TABLET BY MOUTH IN THE MORNING   . [DISCONTINUED] 0.9 %  sodium chloride infusion     No facility-administered encounter medications on file as of 01/18/2017.     Allergies  Allergen Reactions  . Other     Pt stated he had a surgery on 10/19/2004, was under deep anaesthesia, did well waking up, but had continuous episodes of passing out when trying to stand.   . Betadine [Povidone Iodine] Hives and Rash    Blistered skin     Review of Systems  Constitutional: Positive for malaise/fatigue.  HENT: Positive for congestion and sinus pain.   Respiratory: Negative.   Cardiovascular: Negative.   Musculoskeletal: Negative.   Neurological: Positive for headaches.    Objective:  BP  116/72   Pulse 72   Temp 98.4 F (36.9 C)   Resp 16   Wt 272 lb (123.4 kg)   BMI 39.03 kg/m   Physical Exam  Constitutional: He is oriented to person, place, and time and well-developed, well-nourished, and in no distress.  HENT:  Head: Normocephalic and atraumatic.  Right Ear: External ear normal.  Left Ear: External ear normal.  Nose: Right sinus exhibits maxillary sinus tenderness. Left sinus exhibits maxillary sinus tenderness.  Mouth/Throat: Oropharynx is clear and moist.  Cold sore on the right side of his nose present.  Eyes: Conjunctivae are normal. Pupils are equal, round, and reactive to light.  Neck: Normal range of  motion. Neck supple.  Cardiovascular: Normal rate, regular rhythm, normal heart sounds and intact distal pulses.   No murmur heard. Pulmonary/Chest: Effort normal and breath sounds normal. No respiratory distress. He has no wheezes.  Neurological: He is alert and oriented to person, place, and time.    Assessment and Plan :  1. Acute non-recurrent maxillary sinusitis Start Augmentin. Follow as needed.  2. Fever blister RX for Valacyclovir provided. Follow as needed. Chronic recurrents.  HPI, Exam and A&P transcribed under direction and in the presence of Miguel Aschoff, MD. I have done the exam and reviewed the chart and it is accurate to the best of my knowledge. Development worker, community has been used and  any errors in dictation or transcription are unintentional. Miguel Aschoff M.D. [facility]

## 2017-03-01 ENCOUNTER — Encounter: Payer: Self-pay | Admitting: Family Medicine

## 2017-03-01 ENCOUNTER — Ambulatory Visit (INDEPENDENT_AMBULATORY_CARE_PROVIDER_SITE_OTHER): Payer: BLUE CROSS/BLUE SHIELD | Admitting: Family Medicine

## 2017-03-01 VITALS — BP 116/86 | HR 81 | Temp 98.1°F | Resp 16 | Wt 273.6 lb

## 2017-03-01 DIAGNOSIS — J0100 Acute maxillary sinusitis, unspecified: Secondary | ICD-10-CM

## 2017-03-01 DIAGNOSIS — Z8739 Personal history of other diseases of the musculoskeletal system and connective tissue: Secondary | ICD-10-CM | POA: Diagnosis not present

## 2017-03-01 MED ORDER — PREDNISONE 20 MG PO TABS: ORAL_TABLET | ORAL | 0 refills | Status: DC

## 2017-03-01 MED ORDER — LEVOFLOXACIN 750 MG PO TABS: 750.0000 mg | ORAL_TABLET | Freq: Every day | ORAL | 0 refills | Status: DC

## 2017-03-01 NOTE — Patient Instructions (Addendum)
Discussed use of Mucinex D for congestion and Delsym for cough. Start prednisone once you are better from the sinus infection. Stop meloxicam while on prednisone.

## 2017-03-01 NOTE — Progress Notes (Signed)
Subjective:     Patient ID: Maxwell Brown, male   DOB: 07/07/1966, 51 y.o.   MRN: [id number]  HPI  Chief Complaint  Patient presents with  . Cough    Patient comes in office today with conerns of cough and congestion for the past two months intermittent. 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 Patient reports that he his cough is productive of green sputum and has green nasal drainage. Patient denies sinus pain or pressure but reports shortness of breath for 24hrs and tightness in chest. Patient has been taking otc Tylenol with no relief.   States he never cleared sinusitis from o.v. of 2/6 treated with Augmentin. Also states his DDD in his upper spine is flaring despite use of daily Meloxicam. Has hx of cervical and lumbar spinal surgeries. Accompanied by his wife today.   Review of Systems  Constitutional: Positive for fatigue.       Objective:   Physical Exam  Constitutional: He appears well-developed and well-nourished. No distress.  Musculoskeletal:  Localizes pain to his T1 vertebra (radiates toward his shoulder) with mild tenderness on palpation.  Ears: T.M's intact without inflammation Sinuses: mild maxillary sinus tenderness Throat: no tonsillar enlargement or exudate Neck: no cervical adenopathy Lungs: clear     Assessment:    1. Acute non-recurrent maxillary sinusitis - levofloxacin (LEVAQUIN) 750 MG tablet; Take 1 tablet (750 mg total) by mouth daily.  Dispense: 5 tablet; Refill: 0  2. H/O degenerative disc disease - predniSONE (DELTASONE) 20 MG tablet; Taper as follows: 3 pills for 4 days, two pills for 4 days, one pill for four days  Dispense: 24 tablet; Refill: 0    Plan:    Call for ENT referral if sinuses not clearing. Start prednisone  after sinuses better. Work excuse for 3/20-3/21.

## 2017-04-18 ENCOUNTER — Other Ambulatory Visit: Payer: Self-pay | Admitting: Family Medicine

## 2017-04-18 MED ORDER — MELOXICAM 15 MG PO TABS: 15.0000 mg | ORAL_TABLET | Freq: Every morning | ORAL | 3 refills | Status: DC

## 2017-04-18 NOTE — Telephone Encounter (Signed)
[facility] pharmacy faxed a request for a 90-day supply on the following medication. Thanks CC  meloxicam (MOBIC) 15 MG tablet  Take 1 tablet by mouth in the morning.

## 2017-07-25 ENCOUNTER — Ambulatory Visit (INDEPENDENT_AMBULATORY_CARE_PROVIDER_SITE_OTHER): Payer: BLUE CROSS/BLUE SHIELD | Admitting: Family Medicine

## 2017-07-25 ENCOUNTER — Encounter: Payer: Self-pay | Admitting: Family Medicine

## 2017-07-25 VITALS — BP 114/86 | HR 62 | Temp 98.0°F | Resp 14 | Ht 70.25 in | Wt 275.0 lb

## 2017-07-25 DIAGNOSIS — K219 Gastro-esophageal reflux disease without esophagitis: Secondary | ICD-10-CM

## 2017-07-25 DIAGNOSIS — M175 Other unilateral secondary osteoarthritis of knee: Secondary | ICD-10-CM | POA: Diagnosis not present

## 2017-07-25 DIAGNOSIS — M167 Other unilateral secondary osteoarthritis of hip: Secondary | ICD-10-CM

## 2017-07-25 DIAGNOSIS — Z125 Encounter for screening for malignant neoplasm of prostate: Secondary | ICD-10-CM | POA: Diagnosis not present

## 2017-07-25 DIAGNOSIS — R739 Hyperglycemia, unspecified: Secondary | ICD-10-CM

## 2017-07-25 DIAGNOSIS — Z0000 Encounter for general adult medical examination without abnormal findings: Secondary | ICD-10-CM

## 2017-07-25 LAB — POCT URINALYSIS DIPSTICK
Bilirubin, UA: NEGATIVE
Blood, UA: NEGATIVE
GLUCOSE UA: NEGATIVE
Ketones, UA: NEGATIVE
LEUKOCYTES UA: NEGATIVE
NITRITE UA: NEGATIVE
Protein, UA: NEGATIVE
Spec Grav, UA: >=1.03 — AB (ref 1.010–1.025)
Urobilinogen, UA: 0.2 E.U./dL
pH, UA: 5 (ref 5.0–8.0)

## 2017-07-25 MED ORDER — TURMERIC 400 MG PO CAPS: 1.0000 | ORAL_CAPSULE | Freq: Every day | ORAL | 0 refills | Status: DC

## 2017-07-25 MED ORDER — ETODOLAC 500 MG PO TABS: 500.0000 mg | ORAL_TABLET | Freq: Two times a day (BID) | ORAL | 5 refills | Status: DC

## 2017-07-25 NOTE — Patient Instructions (Signed)
Try OTC Turmeric daily in addition to the new medication. Stop Meloxicam. Follow up in 2 months.

## 2017-07-25 NOTE — Progress Notes (Signed)
Patient: Maxwell Brown, Male    DOB: 03-15-1966, 51 y.o.   MRN: [id number] Visit Date: 07/25/2017  Today's Provider: [name], MD   Chief Complaint  Patient presents with  . Annual Exam   Subjective:  Maxwell Brown is a 51 y.o. male who presents today for health maintenance and complete physical. He feels fairly well. He reports exercising walks a lot at work. He reports he is sleeping well.  Last Colonoscopy was 10/04/16-2 small adenomas, repeat in 2022 per their notes, done by Dr. Silvano Brown. Last lab work was done on 07/19/16.  Review of Systems  Constitutional: Positive for fatigue.  HENT: Positive for dental problem.   Eyes: Negative.   Respiratory: Negative.   Cardiovascular: Negative.   Gastrointestinal: Positive for rectal pain.  Endocrine: Negative.   Genitourinary: Negative.   Musculoskeletal: Positive for arthralgias, back pain, gait problem, joint swelling, myalgias, neck pain and neck stiffness.  Allergic/Immunologic: Negative.   Psychiatric/Behavioral: Negative.     Social History   Social History  . Marital status: Married    Spouse name: N/A  . Number of children: N/A  . Years of education: N/A   Occupational History  . [employer]    Social History Main Topics  . Smoking status: Never Smoker  . Smokeless tobacco: Never Used  . Alcohol use No  . Drug use: No  . Sexual activity: Not on file   Other Topics Concern  . Not on file   Social History Narrative  . No narrative on file    Patient Active Problem List   Diagnosis Date Noted  . History of adenomatous polyps of colon 10/07/2016  . Allergic rhinitis 04/17/2015  . Borderline diabetes 04/17/2015  . Adiposity 04/17/2015  . Plantar fasciitis 04/17/2015  . Peptic ulcer 04/17/2015  . HNP (herniated nucleus pulposus), lumbar 01/24/2015  . Gastritis 11/03/2011  . Abdominal pain, epigastric 10/29/2011  . DJD (degenerative joint disease), multiple sites 10/29/2011  . Dermatologic  disease 03/30/2010  . Essential (primary) hypertension 05/19/2009  . Esophagitis, reflux 09/03/2008    Past Surgical History:  Procedure Laterality Date  . BACK SURGERY    . LUMBAR FUSION     L3, L4 2016  . NECK SURGERY    . UPPER GI ENDOSCOPY      His family history includes Bone cancer in his father; Diabetes in his mother; Heart disease in his mother; Lung cancer in his father; Transient ischemic attack in his mother.     Outpatient Encounter Prescriptions as of 07/25/2017  Medication Sig Note  . diphenhydrAMINE (BENADRYL) 25 mg capsule Take 25 mg by mouth daily.   . Glucosamine HCl (GLUCOSAMINE PO) Take 4,000 mg by mouth 2 (two) times daily.    Marland Kitchen LYSINE PO Take 1,000 mg by mouth daily.    . meloxicam (MOBIC) 15 MG tablet Take 1 tablet (15 mg total) by mouth every morning.   . metaxalone (SKELAXIN) 800 MG tablet Take 1 tablet (800 mg total) by mouth 3 (three) times daily. 07/25/2017: prn  . RaNITidine HCl (ZANTAC 75 PO) Take 1 tablet by mouth 2 (two) times daily. Not sure of the mg dose  11/12/2016: " off and on"  . valACYclovir (VALTREX) 500 MG tablet Take 1 tablet (500 mg total) by mouth 2 (two) times daily. 07/25/2017: prn  . [DISCONTINUED] levofloxacin (LEVAQUIN) 750 MG tablet Take 1 tablet (750 mg total) by mouth daily.   . [DISCONTINUED] predniSONE (DELTASONE) 20 MG tablet Taper as  follows: 3 pills for 4 days, two pills for 4 days, one pill for four days    No facility-administered encounter medications on file as of 07/25/2017.     Patient Care Team: Maxwell Brown., MD as PCP - General (Unknown Physician Specialty)      Objective:   Vitals:  Vitals:   07/25/17 0904  BP: 114/86  Pulse: 62  Resp: 14  Temp: 98 F (36.7 C)  Weight: 275 lb (124.7 kg)  Height: 5' 10.25" (1.784 m)    Physical Exam  Constitutional: He is oriented to person, place, and time. He appears well-developed and well-nourished.  HENT:  Head: Normocephalic and atraumatic.  Right Ear:  External ear normal.  Left Ear: External ear normal.  Nose: Nose normal.  Mouth/Throat: Oropharynx is clear and moist.  Eyes: Pupils are equal, round, and reactive to light. Conjunctivae are normal. No scleral icterus.  Neck: Normal range of motion. Neck supple. No thyromegaly present.  Cardiovascular: Normal rate, regular rhythm, normal heart sounds and intact distal pulses.  Exam reveals no gallop.   No murmur heard. Pulmonary/Chest: Effort normal and breath sounds normal. No respiratory distress. He has no wheezes.  Abdominal: Soft. He exhibits no distension. There is no tenderness.  Genitourinary: Penis normal. No penile tenderness.  Genitourinary Comments: Defer DRE today  Musculoskeletal: He exhibits no edema or tenderness.  Neurological: He is alert and oriented to person, place, and time.  Skin: Skin is warm and dry. No rash noted. No erythema.  Psychiatric: He has a normal mood and affect. His behavior is normal. Judgment and thought content normal.   Depression Screen PHQ 2/9 Scores 07/25/2017  PHQ - 2 Score 0  PHQ- 9 Score 4    Assessment & Plan:    1. Annual physical exam Defer DRE today. - CBC with Differential/Platelet - Comprehensive metabolic panel - Lipid Panel With LDL/HDL Ratio - TSH - POCT urinalysis dipstick  2. Screening for prostate cancer - PSA  3. Gastroesophageal reflux disease, esophagitis presence not specified  4. Hyperglycemia - HgB A1c  5. Other secondary osteoarthritis of right hip Try Etodolac, try OTC Turmeric. Stop Meloxicam. Patient has tried Naproxen in the past. Follow up in 2 months. - etodolac (LODINE) 500 MG tablet; Take 1 tablet (500 mg total) by mouth 2 (two) times daily.  Dispense: 60 tablet; Refill: 5 - Turmeric 400 MG CAPS; Take 1 capsule by mouth daily.  Dispense: 30 capsule; Refill: 0  6. Other secondary osteoarthritis of right knee Try Etodolac, try OTC Turmeric. Stop Meloxicam. Patient has tried Naproxen in the past.  Follow up in 2 months. - etodolac (LODINE) 500 MG tablet; Take 1 tablet (500 mg total) by mouth 2 (two) times daily.  Dispense: 60 tablet; Refill: 5 - Turmeric 400 MG CAPS; Take 1 capsule by mouth daily.  Dispense: 30 capsule; Refill: 0  HPI, Exam and A&P transcribed by Theressa Millard, RMA under direction and in the presence of Miguel Aschoff, MD. I have done the exam and reviewed the chart and it is accurate to the best of my knowledge. Development worker, community has been used and  any errors in dictation or transcription are unintentional. Miguel Aschoff M.D. [facility]

## 2017-07-26 LAB — COMPREHENSIVE METABOLIC PANEL
A/G RATIO: 1.8 (ref 1.2–2.2)
ALK PHOS: 63 IU/L (ref 39–117)
ALT: 16 IU/L (ref 0–44)
AST: 18 IU/L (ref 0–40)
Albumin: 4.2 g/dL (ref 3.5–5.5)
BUN/Creatinine Ratio: 14 (ref 9–20)
BUN: 12 mg/dL (ref 6–24)
Bilirubin Total: 0.3 mg/dL (ref 0.0–1.2)
CO2: 24 mmol/L (ref 20–29)
Calcium: 9.2 mg/dL (ref 8.7–10.2)
Chloride: 106 mmol/L (ref 96–106)
Creatinine, Ser: 0.87 mg/dL (ref 0.76–1.27)
GFR calc Af Amer: 116 mL/min/{1.73_m2} (ref >59)
GFR calc non Af Amer: 100 mL/min/{1.73_m2} (ref >59)
GLOBULIN, TOTAL: 2.4 g/dL (ref 1.5–4.5)
Glucose: 94 mg/dL (ref 65–99)
POTASSIUM: 5 mmol/L (ref 3.5–5.2)
SODIUM: 141 mmol/L (ref 134–144)
Total Protein: 6.6 g/dL (ref 6.0–8.5)

## 2017-07-26 LAB — LIPID PANEL WITH LDL/HDL RATIO
CHOLESTEROL TOTAL: 169 mg/dL (ref 100–199)
HDL: 45 mg/dL (ref >39)
LDL Calculated: 104 mg/dL — ABNORMAL HIGH (ref 0–99)
LDl/HDL Ratio: 2.3 ratio (ref 0.0–3.6)
TRIGLYCERIDES: 98 mg/dL (ref 0–149)
VLDL Cholesterol Cal: 20 mg/dL (ref 5–40)

## 2017-07-26 LAB — CBC WITH DIFFERENTIAL/PLATELET
Basophils Absolute: 0 x10E3/uL (ref 0.0–0.2)
Basos: 1 %
EOS (ABSOLUTE): 0.2 x10E3/uL (ref 0.0–0.4)
Eos: 4 %
Hematocrit: 44.6 % (ref 37.5–51.0)
Hemoglobin: 14.9 g/dL (ref 13.0–17.7)
Immature Grans (Abs): 0 x10E3/uL (ref 0.0–0.1)
Immature Granulocytes: 1 %
Lymphocytes Absolute: 2.1 x10E3/uL (ref 0.7–3.1)
Lymphs: 37 %
MCH: 29.9 pg (ref 26.6–33.0)
MCHC: 33.4 g/dL (ref 31.5–35.7)
MCV: 90 fL (ref 79–97)
Monocytes Absolute: 0.6 x10E3/uL (ref 0.1–0.9)
Monocytes: 10 %
Neutrophils Absolute: 2.8 x10E3/uL (ref 1.4–7.0)
Neutrophils: 47 %
Platelets: 192 x10E3/uL (ref 150–379)
RBC: 4.98 x10E6/uL (ref 4.14–5.80)
RDW: 14.5 % (ref 12.3–15.4)
WBC: 5.8 x10E3/uL (ref 3.4–10.8)

## 2017-07-26 LAB — HEMOGLOBIN A1C
Est. average glucose Bld gHb Est-mCnc: 111 mg/dL
Hgb A1c MFr Bld: 5.5 % (ref 4.8–5.6)

## 2017-07-26 LAB — PSA: Prostate Specific Ag, Serum: 1 ng/mL (ref 0.0–4.0)

## 2017-07-26 LAB — TSH: TSH: 2.74 u[IU]/mL (ref 0.450–4.500)

## 2017-09-19 ENCOUNTER — Ambulatory Visit (INDEPENDENT_AMBULATORY_CARE_PROVIDER_SITE_OTHER): Payer: BLUE CROSS/BLUE SHIELD | Admitting: Family Medicine

## 2017-09-19 ENCOUNTER — Encounter: Payer: Self-pay | Admitting: Family Medicine

## 2017-09-19 VITALS — BP 120/80 | HR 72 | Temp 98.2°F | Resp 16 | Ht 70.0 in | Wt 280.0 lb

## 2017-09-19 DIAGNOSIS — S838X1D Sprain of other specified parts of right knee, subsequent encounter: Secondary | ICD-10-CM

## 2017-09-19 DIAGNOSIS — M1711 Unilateral primary osteoarthritis, right knee: Secondary | ICD-10-CM | POA: Diagnosis not present

## 2017-09-19 MED ORDER — CELECOXIB 200 MG PO CAPS: 200.0000 mg | ORAL_CAPSULE | Freq: Every day | ORAL | 5 refills | Status: DC

## 2017-09-19 NOTE — Progress Notes (Signed)
Patient: Maxwell Brown Male    DOB: Feb 07, 1966   51 y.o.   MRN: [id number] Visit Date: 09/19/2017  Today's Provider: [name], MD   Chief Complaint  Patient presents with  . Arthritis   Subjective:    HPI Patient comes in today for a follow up. He was last seen in the office 2 months ago due to arthritis in his right knee. He was advised to try Etodolac and Tumeric, and discontinue Mobic. Patient reports that he feels like his knee healed itself. He reports that one day last week, he was on both of his knees and when standing up, he heard a loud "pop". He states when he got up to walk, the pain was gone.     Allergies  Allergen Reactions  . Other     Pt stated he had a surgery on 10/19/2004, was under deep anaesthesia, did well waking up, but had continuous episodes of passing out when trying to stand.   . Betadine [Povidone Iodine] Hives and Rash    Blistered skin      Current Outpatient Prescriptions:  .  diphenhydrAMINE (BENADRYL) 25 mg capsule, Take 25 mg by mouth daily., Disp: , Rfl:  .  etodolac (LODINE) 500 MG tablet, Take 1 tablet (500 mg total) by mouth 2 (two) times daily., Disp: 60 tablet, Rfl: 5 .  LYSINE PO, Take 1,000 mg by mouth daily. , Disp: , Rfl:  .  RaNITidine HCl (ZANTAC 75 PO), Take 1 tablet by mouth 2 (two) times daily. Not sure of the mg dose , Disp: , Rfl:  .  Turmeric 400 MG CAPS, Take 1 capsule by mouth daily., Disp: 30 capsule, Rfl: 0 .  valACYclovir (VALTREX) 500 MG tablet, Take 1 tablet (500 mg total) by mouth 2 (two) times daily., Disp: 60 tablet, Rfl: 0 .  Glucosamine HCl (GLUCOSAMINE PO), Take 4,000 mg by mouth 2 (two) times daily. , Disp: , Rfl:  .  metaxalone (SKELAXIN) 800 MG tablet, Take 1 tablet (800 mg total) by mouth 3 (three) times daily. (Patient not taking: Reported on 09/19/2017), Disp: 90 tablet, Rfl: 5  Review of Systems  Constitutional: Negative.   Musculoskeletal: Negative for arthralgias, back pain, gait problem,  joint swelling and myalgias.  Neurological: Negative for weakness.    Social History  Substance Use Topics  . Smoking status: Never Smoker  . Smokeless tobacco: Never Used  . Alcohol use No   Objective:   BP 120/80 (BP Location: Right Arm, Patient Position: Sitting, Cuff Size: Large)   Pulse 72   Temp 98.2 F (36.8 C)   Resp 16   Ht 5\' 10"  (1.778 m)   Wt 280 lb (127 kg)   BMI 40.18 kg/m  Vitals:   09/19/17 1116  BP: 120/80  Pulse: 72  Resp: 16  Temp: 98.2 F (36.8 C)  Weight: 280 lb (127 kg)  Height: 5\' 10"  (1.778 m)     Physical Exam  Constitutional: He is oriented to person, place, and time. He appears well-developed and well-nourished.  Musculoskeletal: Normal range of motion.  Neurological: He is alert and oriented to person, place, and time.  Skin: Skin is warm and dry.        Assessment & Plan:     1. Osteoarthritis of right knee, unspecified osteoarthritis type Improved, but still not better. Will discontinue Etodolac and start Celebrex as below.  - celecoxib (CELEBREX) 200 MG capsule; Take 1 capsule (200  mg total) by mouth daily.  Dispense: 30 capsule; Refill: 5  2. Meniscal injury, right, subsequent encounter Orthopedic referral if he worsens or it recurs.        Milyn Stapleton Cranford Mon, MD  [facility]

## 2017-09-28 ENCOUNTER — Other Ambulatory Visit: Payer: Self-pay | Admitting: Family Medicine

## 2018-03-03 ENCOUNTER — Ambulatory Visit: Payer: BLUE CROSS/BLUE SHIELD | Admitting: Family Medicine

## 2018-03-03 ENCOUNTER — Encounter: Payer: Self-pay | Admitting: Family Medicine

## 2018-03-03 VITALS — BP 118/78 | HR 99 | Temp 98.4°F | Wt 280.6 lb

## 2018-03-03 DIAGNOSIS — J0100 Acute maxillary sinusitis, unspecified: Secondary | ICD-10-CM

## 2018-03-03 DIAGNOSIS — J40 Bronchitis, not specified as acute or chronic: Secondary | ICD-10-CM

## 2018-03-03 MED ORDER — CLARITHROMYCIN 500 MG PO TABS: 500.0000 mg | ORAL_TABLET | Freq: Two times a day (BID) | ORAL | 0 refills | Status: DC

## 2018-03-03 NOTE — Progress Notes (Signed)
Patient: Maxwell Brown Male    DOB: [birth year]   52 y.o.   MRN: [id number] Visit Date: 03/03/2018  Today's Provider: Vernie Murders, PA   Chief Complaint  Patient presents with  . Sinusitis   Subjective:    Sinusitis  This is a new problem. The current episode started yesterday. The problem has been gradually worsening since onset. Associated symptoms include chills, congestion, coughing, headaches, sinus pressure, sneezing and a sore throat. (Fever ) Past treatments include acetaminophen. The treatment provided mild relief.   Past Medical History:  Diagnosis Date  . Arthritis   . Complication of anesthesia    2005 had a 6-7 hr surgery, he had difficulty whenever he got up, would pass out.  heart tests were normal  "anesthesia" related he was told  . Gastritis    last flare up was 3 yrs ago  . GERD (gastroesophageal reflux disease)   . History of adenomatous polyps of colon 10/07/2016   Past Surgical History:  Procedure Laterality Date  . BACK SURGERY    . LUMBAR FUSION     L3, L4 2016  . NECK SURGERY    . UPPER GI ENDOSCOPY     Family History  Problem Relation Age of Onset  . Diabetes Mother   . Heart disease Mother   . Transient ischemic attack Mother   . Bone cancer Father   . Lung cancer Father   . Colon cancer Neg Hx    Allergies  Allergen Reactions  . Other     Pt stated he had a surgery on 10/19/2004, was under deep anaesthesia, did well waking up, but had continuous episodes of passing out when trying to stand.   . Betadine [Povidone Iodine] Hives and Rash    Blistered skin     Current Outpatient Medications:  .  celecoxib (CELEBREX) 200 MG capsule, Take 1 capsule (200 mg total) by mouth daily., Disp: 30 capsule, Rfl: 5 .  diphenhydrAMINE (ALLERGY RELIEF) 25 MG tablet, Take 25 mg by mouth every 6 (six) hours as needed., Disp: , Rfl:  .  RaNITidine HCl (ZANTAC 75 PO), Take 1 tablet by mouth 2 (two) times daily. Not sure of the mg dose , Disp: ,  Rfl:  .  Turmeric Curcumin 500 MG CAPS, Take by mouth., Disp: , Rfl:  .  valACYclovir (VALTREX) 500 MG tablet, TAKE 1 TABLET (500 MG TOTAL) BY MOUTH 2 (TWO) TIMES DAILY., Disp: 60 tablet, Rfl: 12  Review of Systems  Constitutional: Positive for chills and fever.  HENT: Positive for congestion, sinus pressure, sneezing and sore throat.   Respiratory: Positive for cough.   Cardiovascular: Negative.   Neurological: Positive for headaches.   Social History   Tobacco Use  . Smoking status: Never Smoker  . Smokeless tobacco: Never Used  Substance Use Topics  . Alcohol use: No    Alcohol/week: 0.0 oz   Objective:   BP 118/78 (BP Location: Right Arm, Patient Position: Sitting, Cuff Size: Normal)   Pulse 99   Temp 98.4 F (36.9 C) (Oral)   Wt 280 lb 9.6 oz (127.3 kg)   SpO2 95%   BMI 40.26 kg/m   Physical Exam  Constitutional: He is oriented to person, place, and time. He appears well-developed and well-nourished. No distress.  HENT:  Head: Normocephalic and atraumatic.  Right Ear: Hearing and external ear normal.  Left Ear: Hearing and external ear normal.  Nose: Nose normal.  No transillumination of  maxillary sinuses. Red turbinates with swelling and clear to yellow drainage.  Eyes: Conjunctivae and lids are normal. Right eye exhibits no discharge. Left eye exhibits no discharge. No scleral icterus.  Neck: Neck supple.  Cardiovascular: Normal rate and regular rhythm.  Pulmonary/Chest: Effort normal. No respiratory distress.  Rhonchi scattered.  Abdominal: Soft.  Musculoskeletal: Normal range of motion.  Neurological: He is alert and oriented to person, place, and time.  Skin: Skin is intact. No lesion and no rash noted.  Psychiatric: He has a normal mood and affect. His speech is normal and behavior is normal. Thought content normal.      Assessment & Plan:     1. Subacute maxillary sinusitis Onset yesterday with PND, stuffy head and sneezing with congestion. Minimal  tenderness of sinuses. No transillumination of maxillary sinuses. Recommend antihistamine and nasal steroid. Treat with Biaxin as this helped clear infections in the past. Increase fluid intake and recheck if no better in 5-7 day. - clarithromycin (BIAXIN) 500 MG tablet; Take 1 tablet (500 mg total) by mouth 2 (two) times daily.  Dispense: 20 tablet; Refill: 0  2. Bronchitis Onset yesterday with cough and purulent sputum. No headache or wheezing. May use Mucinex-DM and add Biaxin. Recheck prn. - clarithromycin (BIAXIN) 500 MG tablet; Take 1 tablet (500 mg total) by mouth 2 (two) times daily.  Dispense: 20 tablet; Refill: [place], PA  [facility]

## 2018-03-08 ENCOUNTER — Ambulatory Visit: Payer: BLUE CROSS/BLUE SHIELD | Admitting: Physician Assistant

## 2018-03-08 ENCOUNTER — Encounter: Payer: Self-pay | Admitting: Physician Assistant

## 2018-03-08 VITALS — BP 126/82 | HR 88 | Temp 98.2°F | Resp 16 | Wt 279.0 lb

## 2018-03-08 DIAGNOSIS — J40 Bronchitis, not specified as acute or chronic: Secondary | ICD-10-CM | POA: Diagnosis not present

## 2018-03-08 MED ORDER — PREDNISONE 10 MG PO TABS: 10.0000 mg | ORAL_TABLET | Freq: Two times a day (BID) | ORAL | 0 refills | Status: AC

## 2018-03-08 NOTE — Patient Instructions (Signed)

## 2018-03-08 NOTE — Progress Notes (Signed)
Patient: Maxwell Brown Male    DOB: [birth year]   52 y.o.   MRN: [id number] Visit Date: 03/09/2018  Today's Provider: Trinna Post, PA-C   Chief Complaint  Patient presents with  . Bronchitis   Subjective:    Maxwell Brown is a 52 y/o man presenting today for follow up of bronchitis. He was seen in clinic on 03/03/2018 and treated with clarithromycin, which he is currently still taking. He reports mucous has cleared up but he still has a persistent cough that he is concerned may be pneumonia.   URI   Associated symptoms include congestion, coughing, sinus pain, a sore throat and wheezing. Pertinent negatives include no chest pain, ear pain, headaches, neck pain, plugged ear sensation, rash, rhinorrhea, sneezing or swollen glands.  Cough  This is a new problem. The current episode started in the past 7 days. The problem has been unchanged. The cough is productive of sputum. Associated symptoms include a sore throat, shortness of breath and wheezing. Pertinent negatives include no chest pain, chills, ear congestion, ear pain, fever, headaches, heartburn, hemoptysis, myalgias, nasal congestion, postnasal drip, rash or rhinorrhea.       Allergies  Allergen Reactions  . Other     Pt stated he had a surgery on 10/19/2004, was under deep anaesthesia, did well waking up, but had continuous episodes of passing out when trying to stand.   . Betadine [Povidone Iodine] Hives and Rash    Blistered skin      Current Outpatient Medications:  .  celecoxib (CELEBREX) 200 MG capsule, Take 1 capsule (200 mg total) by mouth daily., Disp: 30 capsule, Rfl: 5 .  clarithromycin (BIAXIN) 500 MG tablet, Take 1 tablet (500 mg total) by mouth 2 (two) times daily., Disp: 20 tablet, Rfl: 0 .  diphenhydrAMINE (ALLERGY RELIEF) 25 MG tablet, Take 25 mg by mouth every 6 (six) hours as needed., Disp: , Rfl:  .  RaNITidine HCl (ZANTAC 75 PO), Take 1 tablet by mouth 2 (two) times daily. Not sure of the mg  dose , Disp: , Rfl:  .  Turmeric Curcumin 500 MG CAPS, Take by mouth., Disp: , Rfl:  .  valACYclovir (VALTREX) 500 MG tablet, TAKE 1 TABLET (500 MG TOTAL) BY MOUTH 2 (TWO) TIMES DAILY., Disp: 60 tablet, Rfl: 12 .  predniSONE (DELTASONE) 10 MG tablet, Take 1 tablet (10 mg total) by mouth 2 (two) times daily with a meal for 5 days., Disp: 10 tablet, Rfl: 0  Review of Systems  Constitutional: Positive for fatigue. Negative for activity change, appetite change, chills, diaphoresis, fever and unexpected weight change.  HENT: Positive for congestion, sinus pressure, sinus pain and sore throat. Negative for ear discharge, ear pain, postnasal drip, rhinorrhea, sneezing, tinnitus, trouble swallowing and voice change.   Eyes: Negative.   Respiratory: Positive for cough, chest tightness, shortness of breath and wheezing. Negative for apnea, hemoptysis, choking and stridor.   Cardiovascular: Negative for chest pain.  Gastrointestinal: Negative.  Negative for heartburn.  Musculoskeletal: Negative for myalgias and neck pain.  Skin: Negative for rash.  Neurological: Negative for dizziness, light-headedness and headaches.  Hematological: Negative for adenopathy.    Social History   Tobacco Use  . Smoking status: Never Smoker  . Smokeless tobacco: Never Used  Substance Use Topics  . Alcohol use: No    Alcohol/week: 0.0 oz   Objective:   BP 126/82 (BP Location: Right Arm, Patient Position: Sitting, Cuff Size: Large)  Pulse 88   Temp 98.2 F (36.8 C) (Oral)   Resp 16   Wt 279 lb (126.6 kg)   SpO2 96%   BMI 40.03 kg/m  Vitals:   03/08/18 1515  BP: 126/82  Pulse: 88  Resp: 16  Temp: 98.2 F (36.8 C)  TempSrc: Oral  SpO2: 96%  Weight: 279 lb (126.6 kg)     Physical Exam  Constitutional: He is oriented to person, place, and time. He appears well-developed and well-nourished.  Cardiovascular: Normal rate and regular rhythm.  Pulmonary/Chest: Effort normal. He has wheezes.  Wheezes and  rhonchi bilaterally throughout lung fields.   Neurological: He is alert and oriented to person, place, and time.  Skin: Skin is warm and dry.  Psychiatric: He has a normal mood and affect. His behavior is normal.        Assessment & Plan:     1. Bronchitis  Continue abx. Will add prednisone. Offered inhaler, but patient declines. Return precautions counseled.  - predniSONE (DELTASONE) 10 MG tablet; Take 1 tablet (10 mg total) by mouth 2 (two) times daily with a meal for 5 days.  Dispense: 10 tablet; Refill: 0  Return if symptoms worsen or fail to improve.  The entirety of the information documented in the History of Present Illness, Review of Systems and Physical Exam were personally obtained by me. Portions of this information were initially documented by Ashley Royalty, CMA and reviewed by me for thoroughness and accuracy.        Trinna Post, PA-C  [facility]

## 2018-03-16 ENCOUNTER — Other Ambulatory Visit: Payer: Self-pay | Admitting: Family Medicine

## 2018-03-16 DIAGNOSIS — M1711 Unilateral primary osteoarthritis, right knee: Secondary | ICD-10-CM

## 2018-05-04 ENCOUNTER — Ambulatory Visit
Admission: RE | Admit: 2018-05-04 | Discharge: 2018-05-04 | Disposition: A | Discharge status: Home / Self Care | Payer: BLUE CROSS/BLUE SHIELD | Source: Ambulatory Visit | Attending: Family Medicine | Admitting: Family Medicine

## 2018-05-04 ENCOUNTER — Ambulatory Visit: Payer: BLUE CROSS/BLUE SHIELD | Admitting: Family Medicine

## 2018-05-04 ENCOUNTER — Encounter: Payer: Self-pay | Admitting: Family Medicine

## 2018-05-04 VITALS — BP 146/92 | HR 88 | Temp 98.3°F | Wt 282.2 lb

## 2018-05-04 DIAGNOSIS — J40 Bronchitis, not specified as acute or chronic: Secondary | ICD-10-CM

## 2018-05-04 DIAGNOSIS — R062 Wheezing: Secondary | ICD-10-CM | POA: Diagnosis not present

## 2018-05-04 MED ORDER — ALBUTEROL SULFATE (2.5 MG/3ML) 0.083% IN NEBU: 2.5000 mg | INHALATION_SOLUTION | Freq: Once | RESPIRATORY_TRACT | Status: DC

## 2018-05-04 MED ORDER — CEFDINIR 300 MG PO CAPS
300.0000 mg | ORAL_CAPSULE | Freq: Two times a day (BID) | ORAL | 0 refills | Status: DC
Start: 2018-05-04 — End: 2018-08-30

## 2018-05-04 NOTE — Progress Notes (Signed)
Patient: Maxwell Brown Male    DOB: 1966-02-17   52 y.o.   MRN: [id number] Visit Date: 05/04/2018  Today's Provider: Vernie Murders, PA   Chief Complaint  Patient presents with  . Cough  . Congestion   Subjective:    URI   This is a new problem. Episode onset: Saturday. The problem has been unchanged. There has been no fever. Associated symptoms include congestion, coughing (productive), headaches, rhinorrhea and sneezing. Pertinent negatives include no ear pain or sore throat. Associated symptoms comments:  . He has tried acetaminophen for the symptoms. The treatment provided mild relief.   Past Medical History:  Diagnosis Date  . Arthritis   . Complication of anesthesia    2005 had a 6-7 hr surgery, he had difficulty whenever he got up, would pass out.  heart tests were normal  "anesthesia" related he was told  . Gastritis    last flare up was 3 yrs ago  . GERD (gastroesophageal reflux disease)   . History of adenomatous polyps of colon 10/07/2016   Past Surgical History:  Procedure Laterality Date  . BACK SURGERY    . LUMBAR FUSION     L3, L4 2016  . NECK SURGERY    . UPPER GI ENDOSCOPY     Family History  Problem Relation Age of Onset  . Diabetes Mother   . Heart disease Mother   . Transient ischemic attack Mother   . Bone cancer Father   . Lung cancer Father   . Colon cancer Neg Hx    Allergies  Allergen Reactions  . Other     Pt stated he had a surgery on 10/19/2004, was under deep anaesthesia, did well waking up, but had continuous episodes of passing out when trying to stand.   . Betadine [Povidone Iodine] Hives and Rash    Blistered skin     Current Outpatient Medications:  .  celecoxib (CELEBREX) 200 MG capsule, TAKE 1 CAPSULE BY MOUTH EVERY DAY, Disp: 30 capsule, Rfl: 5 .  diphenhydrAMINE (ALLERGY RELIEF) 25 MG tablet, Take 25 mg by mouth every 6 (six) hours as needed., Disp: , Rfl:  .  RaNITidine HCl (ZANTAC 75 PO), Take 1 tablet by mouth 2  (two) times daily. Not sure of the mg dose , Disp: , Rfl:  .  Turmeric Curcumin 500 MG CAPS, Take by mouth., Disp: , Rfl:  .  valACYclovir (VALTREX) 500 MG tablet, TAKE 1 TABLET (500 MG TOTAL) BY MOUTH 2 (TWO) TIMES DAILY., Disp: 60 tablet, Rfl: 12  Review of Systems  Constitutional: Positive for fever.  HENT: Positive for congestion, rhinorrhea and sneezing. Negative for ear pain and sore throat.   Respiratory: Positive for cough (productive).   Cardiovascular: Negative.   Neurological: Positive for headaches.   Social History   Tobacco Use  . Smoking status: Never Smoker  . Smokeless tobacco: Never Used  Substance Use Topics  . Alcohol use: No    Alcohol/week: 0.0 oz   Objective:   BP (!) 146/92 (BP Location: Right Arm, Patient Position: Sitting, Cuff Size: Normal)   Pulse 88   Temp 98.3 F (36.8 C) (Oral)   Wt 282 lb 3.2 oz (128 kg)   SpO2 99%   BMI 40.49 kg/m   Physical Exam  Constitutional: He is oriented to person, place, and time. He appears well-developed and well-nourished. No distress.  HENT:  Head: Normocephalic and atraumatic.  Right Ear: Hearing and external ear  normal.  Left Ear: Hearing and external ear normal.  Turbinates pink and swollen 3+ with clear drainage. No transillumination of maxillary sinuses without tenderness. Slight cobblestone appearance of posterior pharynx without exudates.  Eyes: Conjunctivae and lids are normal. Right eye exhibits no discharge. Left eye exhibits no discharge. No scleral icterus.  Neck: Neck supple.  Cardiovascular: Normal rate and regular rhythm.  Pulmonary/Chest: Effort normal. No respiratory distress. He has wheezes.  Bubbly rhonchi in left upper chest with some wheezes.  Abdominal: Soft.  Musculoskeletal: Normal range of motion.  Lymphadenopathy:    He has no cervical adenopathy.  Neurological: He is alert and oriented to person, place, and time.  Skin: Skin is intact. No lesion and no rash noted.  Psychiatric: He  has a normal mood and affect. His speech is normal and behavior is normal. Thought content normal.      Assessment & Plan:     1. Wheeze Onset over the past 4-5 days with hacking cough and yellow sputum production. Had a severe coughing spell with and episode of acid reflux Saturday 04-29-18. Has had a bubbly wheeze sensation in the left upper chest but no fever. Given Albuterol treatment by a nebulizer and wheeze improve. Feeling less tight. Will get x-ray of chest to rule out aspiration pneumonia. - albuterol (PROVENTIL) (2.5 MG/3ML) 0.083% nebulizer solution 2.5 mg  2. Bronchitis Check x-ray and CBC to rule out pneumonia. Treat with Omnicef and Mucinex-DM. Recheck pending reports. - CBC with Differential/Platelet - DG Chest 2 View - cefdinir (OMNICEF) 300 MG capsule; Take 1 capsule (300 mg total) by mouth 2 (two) times daily.  Dispense: 20 capsule; Refill: [place], [place]  [facility]

## 2018-05-05 ENCOUNTER — Telehealth: Payer: Self-pay | Admitting: Family Medicine

## 2018-05-05 ENCOUNTER — Other Ambulatory Visit: Payer: Self-pay | Admitting: Family Medicine

## 2018-05-05 LAB — CBC WITH DIFFERENTIAL/PLATELET
BASOS ABS: 0 10*3/uL (ref 0.0–0.2)
Basos: 0 %
EOS (ABSOLUTE): 0.4 10*3/uL (ref 0.0–0.4)
Eos: 5 %
Hematocrit: 43.2 % (ref 37.5–51.0)
Hemoglobin: 14.3 g/dL (ref 13.0–17.7)
Immature Grans (Abs): 0 10*3/uL (ref 0.0–0.1)
Immature Granulocytes: 0 %
LYMPHS ABS: 2.6 10*3/uL (ref 0.7–3.1)
LYMPHS: 34 %
MCH: 30.2 pg (ref 26.6–33.0)
MCHC: 33.1 g/dL (ref 31.5–35.7)
MCV: 91 fL (ref 79–97)
MONOCYTES: 12 %
Monocytes Absolute: 0.9 10*3/uL (ref 0.1–0.9)
Neutrophils Absolute: 3.7 10*3/uL (ref 1.4–7.0)
Neutrophils: 49 %
Platelets: 199 10*3/uL (ref 150–450)
RBC: 4.74 x10E6/uL (ref 4.14–5.80)
RDW: 14.4 % (ref 12.3–15.4)
WBC: 7.6 10*3/uL (ref 3.4–10.8)

## 2018-05-05 MED ORDER — ALBUTEROL SULFATE HFA 108 (90 BASE) MCG/ACT IN AERS: 2.0000 | INHALATION_SPRAY | Freq: Four times a day (QID) | RESPIRATORY_TRACT | 2 refills | Status: DC | PRN

## 2018-05-05 NOTE — Telephone Encounter (Signed)
Please review. Thanks!  

## 2018-05-05 NOTE — Telephone Encounter (Signed)
Pt requesting lab results and chest xray results.

## 2018-05-05 NOTE — Progress Notes (Signed)
A little less cough but still has some slight wheeze. Will continue Omnicef and add Proventil for wheeze. Call report of progress or schedule follow up appointment in 4-5 days.

## 2018-05-05 NOTE — Telephone Encounter (Signed)
Called patient to advise CBC and CXR was negative for pneumonia. Suspect bronchitis with wheezing. Will add Proventil-HFA to the [facility] and he will call report of progress in 4-5 days.

## 2018-05-09 ENCOUNTER — Ambulatory Visit: Payer: BLUE CROSS/BLUE SHIELD | Admitting: Family Medicine

## 2018-05-09 ENCOUNTER — Encounter: Payer: Self-pay | Admitting: Family Medicine

## 2018-05-09 ENCOUNTER — Telehealth: Payer: Self-pay

## 2018-05-09 VITALS — BP 122/84 | HR 76 | Temp 97.7°F | Wt 280.2 lb

## 2018-05-09 DIAGNOSIS — J309 Allergic rhinitis, unspecified: Secondary | ICD-10-CM

## 2018-05-09 DIAGNOSIS — R0602 Shortness of breath: Secondary | ICD-10-CM | POA: Diagnosis not present

## 2018-05-09 DIAGNOSIS — J40 Bronchitis, not specified as acute or chronic: Secondary | ICD-10-CM | POA: Diagnosis not present

## 2018-05-09 MED ORDER — BENZONATATE 200 MG PO CAPS: 200.0000 mg | ORAL_CAPSULE | Freq: Two times a day (BID) | ORAL | 0 refills | Status: DC | PRN

## 2018-05-09 MED ORDER — PREDNISONE 10 MG PO TABS: 10.0000 mg | ORAL_TABLET | Freq: Every day | ORAL | 0 refills | Status: DC

## 2018-05-09 NOTE — Telephone Encounter (Signed)
Pt was in last week and sen Maxwell Brown.  He was diagnosed with bronchitis.  He has not gotten any better since he came in and he is taking the medication prescribed  Please advise [phone]  Thanks teri

## 2018-05-09 NOTE — Telephone Encounter (Signed)
-----   Message from Margo Common, [place] sent at 05/05/2018  5:11 PM EDT ----- Chest x-ray and CBC essentially normal without signs of pneumonia.

## 2018-05-09 NOTE — Telephone Encounter (Signed)
Patient scheduled at 3:40 today.

## 2018-05-09 NOTE — Telephone Encounter (Signed)
Patient advised. Patient states his symptoms are  unchanged since 05/04/18. He states he is still coughing and SOB. Please advise.  Pharmacy- [place]. [phone].

## 2018-05-09 NOTE — Telephone Encounter (Signed)
Maxwell Brown, Dr Rosanna Randy doesn't have any openings today.  Do you think this patient should come in and if so can you see him since you started his treatment? Thanks

## 2018-05-09 NOTE — Telephone Encounter (Signed)
Yes, left a message with Lexine Baton already.

## 2018-05-09 NOTE — Progress Notes (Signed)
Patient: Maxwell Brown Male    DOB: 15-Jul-1966   52 y.o.   MRN: [id number] Visit Date: 05/09/2018  Today's Provider: Vernie Murders, PA   Chief Complaint  Patient presents with  . Bronchitis  . Follow-up   Subjective:    HPI Bronchitis:  Patient presents for a 5 day follow up. Last OV was on 05/04/18. Labs and CXR were normal. He started Omnicef and Proventil-HFA for bronchitis. Patient states symptoms include cough, congestion and SOB which are unchanged since last OV. He is still taking medications as prescribed.     Past Medical History:  Diagnosis Date  . Arthritis   . Complication of anesthesia    2005 had a 6-7 hr surgery, he had difficulty whenever he got up, would pass out.  heart tests were normal  "anesthesia" related he was told  . Gastritis    last flare up was 3 yrs ago  . GERD (gastroesophageal reflux disease)   . History of adenomatous polyps of colon 10/07/2016   Past Surgical History:  Procedure Laterality Date  . BACK SURGERY    . LUMBAR FUSION     L3, L4 2016  . NECK SURGERY    . UPPER GI ENDOSCOPY     Family History  Problem Relation Age of Onset  . Diabetes Mother   . Heart disease Mother   . Transient ischemic attack Mother   . Bone cancer Father   . Lung cancer Father   . Colon cancer Neg Hx    Allergies  Allergen Reactions  . Other     Pt stated he had a surgery on 10/19/2004, was under deep anaesthesia, did well waking up, but had continuous episodes of passing out when trying to stand.   . Betadine [Povidone Iodine] Hives and Rash    Blistered skin     Current Outpatient Medications:  .  albuterol (PROVENTIL HFA;VENTOLIN HFA) 108 (90 Base) MCG/ACT inhaler, Inhale 2 puffs into the lungs every 6 (six) hours as needed for wheezing or shortness of breath., Disp: 1 Inhaler, Rfl: 2 .  cefdinir (OMNICEF) 300 MG capsule, Take 1 capsule (300 mg total) by mouth 2 (two) times daily., Disp: 20 capsule, Rfl: 0 .  celecoxib (CELEBREX) 200 MG  capsule, TAKE 1 CAPSULE BY MOUTH EVERY DAY, Disp: 30 capsule, Rfl: 5 .  diphenhydrAMINE (ALLERGY RELIEF) 25 MG tablet, Take 25 mg by mouth every 6 (six) hours as needed., Disp: , Rfl:  .  RaNITidine HCl (ZANTAC 75 PO), Take 1 tablet by mouth 2 (two) times daily. Not sure of the mg dose , Disp: , Rfl:  .  Turmeric Curcumin 500 MG CAPS, Take by mouth., Disp: , Rfl:  .  valACYclovir (VALTREX) 500 MG tablet, TAKE 1 TABLET (500 MG TOTAL) BY MOUTH 2 (TWO) TIMES DAILY., Disp: 60 tablet, Rfl: 12  Current Facility-Administered Medications:  .  albuterol (PROVENTIL) (2.5 MG/3ML) 0.083% nebulizer solution 2.5 mg, 2.5 mg, Nebulization, Once, Chrismon, Vickki Muff, PA  Review of Systems  Constitutional: Negative.   HENT: Positive for congestion.   Respiratory: Positive for cough, shortness of breath and wheezing.   Cardiovascular: Negative.    Social History   Tobacco Use  . Smoking status: Never Smoker  . Smokeless tobacco: Never Used  Substance Use Topics  . Alcohol use: No    Alcohol/week: 0.0 oz   Objective:   BP 122/84 (BP Location: Right Arm, Patient Position: Sitting, Cuff Size: Large)   Pulse 76  Temp 97.7 F (36.5 C) (Oral)   Wt 280 lb 3.2 oz (127.1 kg)   SpO2 97%   BMI 40.20 kg/m   Physical Exam  Constitutional: He is oriented to person, place, and time. He appears well-developed and well-nourished. No distress.  HENT:  Head: Normocephalic and atraumatic.  Right Ear: Hearing and external ear normal.  Left Ear: Hearing and external ear normal.  Turbinates slightly reddened and 3+ swollen with clear drainage. Poor transillumination of maxillary sinuses.  Eyes: Conjunctivae and lids are normal. Right eye exhibits no discharge. Left eye exhibits no discharge. No scleral icterus.  Neck: Neck supple.  Cardiovascular: Normal rate.  Pulmonary/Chest: Effort normal and breath sounds normal. No respiratory distress.  Abdominal: Soft. Bowel sounds are normal.  Musculoskeletal: Normal  range of motion.  Neurological: He is alert and oriented to person, place, and time.  Skin: Skin is intact. No lesion and no rash noted.  Psychiatric: He has a normal mood and affect. His speech is normal and behavior is normal. Thought content normal.      Assessment & Plan:     1. SOB (shortness of breath) Feeling fatigued and coughing a lot with some dyspnea on exertion. Spirometry normal without obstructive or restrictive disease. - Spirometry with graph  2. Allergic rhinitis, unspecified seasonality, unspecified trigger Some rhinorrhea, sneezing and persistent cough despite Omnicef for bronchitis. May use Benadryl prn and given prednisone taper. Increase fluid intake. Recheck prn. - predniSONE (DELTASONE) 10 MG tablet; Take 1 tablet (10 mg total) by mouth daily with breakfast. Taper down by one tablet daily (6,5,4,3,2,1)  Dispense: 21 tablet; Refill: 0  3. Bronchitis Congestion no longer purulent. Cough frequently and causes lightheaded sensation with a headache. No fever. Slightly scratchy throat. Will finish the Omnicef, use Albuterol prn wheeze, add Mucinex-DM BID, prednisone taper and Tessalon Perles for added cough suppression as needed. Recheck prn. Recommend out of work 3 days to allow taper to suppress inflammation in airways. - benzonatate (TESSALON) 200 MG capsule; Take 1 capsule (200 mg total) by mouth 2 (two) times daily as needed for cough.  Dispense: 20 capsule; Refill: 0 - predniSONE (DELTASONE) 10 MG tablet; Take 1 tablet (10 mg total) by mouth daily with breakfast. Taper down by one tablet daily (6,5,4,3,2,1)  Dispense: 21 tablet; Refill: [place], PA  [facility]

## 2018-05-09 NOTE — Telephone Encounter (Signed)
Schedule appointment. May need another treatment and pulmonary function test. If it is only the cough that may be tapering off, may need a stronger cough suppressant at night (I.e.- codeine cough suppressant).

## 2018-07-31 ENCOUNTER — Encounter: Payer: Self-pay | Admitting: Family Medicine

## 2018-08-13 ENCOUNTER — Other Ambulatory Visit: Payer: Self-pay | Admitting: Family Medicine

## 2018-08-13 DIAGNOSIS — M1711 Unilateral primary osteoarthritis, right knee: Secondary | ICD-10-CM

## 2018-08-23 ENCOUNTER — Encounter: Payer: Self-pay | Admitting: Family Medicine

## 2018-08-23 ENCOUNTER — Ambulatory Visit: Payer: BLUE CROSS/BLUE SHIELD | Admitting: Family Medicine

## 2018-08-23 VITALS — BP 148/72 | HR 70 | Temp 98.6°F | Resp 16 | Wt 278.0 lb

## 2018-08-23 DIAGNOSIS — M542 Cervicalgia: Secondary | ICD-10-CM | POA: Diagnosis not present

## 2018-08-23 DIAGNOSIS — M546 Pain in thoracic spine: Secondary | ICD-10-CM | POA: Diagnosis not present

## 2018-08-23 DIAGNOSIS — Z23 Encounter for immunization: Secondary | ICD-10-CM | POA: Diagnosis not present

## 2018-08-23 DIAGNOSIS — M79671 Pain in right foot: Secondary | ICD-10-CM

## 2018-08-23 DIAGNOSIS — G8929 Other chronic pain: Secondary | ICD-10-CM

## 2018-08-23 MED ORDER — PREDNISONE 10 MG (48) PO TBPK: ORAL_TABLET | ORAL | 0 refills | Status: DC

## 2018-08-23 MED ORDER — CYCLOBENZAPRINE HCL 10 MG PO TABS: 10.0000 mg | ORAL_TABLET | Freq: Three times a day (TID) | ORAL | 0 refills | Status: DC | PRN

## 2018-08-23 NOTE — Progress Notes (Signed)
Patient: Maxwell Brown Male    DOB: 1966/01/29   52 y.o.   MRN: [id number] Visit Date: 08/23/2018  Today's Provider: [name], MD   Chief Complaint  Patient presents with  . Back Pain   Subjective:    HPI Patient comes in today c/o lower back pain. He reports that it has gotten worse over the last 2 weeks. He has had 2 back surgeries in the past, and he is starting to have the same symptoms as he did prior to his surgeries.   Patient reports that he has numbness and tingling in his lower back, and it radiates to both legs. Some nights it wakes him up from his sleep. He takes Tylenol for this, but it does not help. 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     Allergies  Allergen Reactions  . Other     Pt stated he had a surgery on 10/19/2004, was under deep anaesthesia, did well waking up, but had continuous episodes of passing out when trying to stand.   . Betadine [Povidone Iodine] Hives and Rash    Blistered skin      Current Outpatient Medications:  .  albuterol (PROVENTIL HFA;VENTOLIN HFA) 108 (90 Base) MCG/ACT inhaler, Inhale 2 puffs into the lungs every 6 (six) hours as needed for wheezing or shortness of breath., Disp: 1 Inhaler, Rfl: 2 .  celecoxib (CELEBREX) 200 MG capsule, TAKE 1 CAPSULE BY MOUTH EVERY DAY, Disp: 90 capsule, Rfl: 1 .  diphenhydrAMINE (ALLERGY RELIEF) 25 MG tablet, Take 25 mg by mouth every 6 (six) hours as needed., Disp: , Rfl:  .  RaNITidine HCl (ZANTAC 75 PO), Take 1 tablet by mouth 2 (two) times daily. Not sure of the mg dose , Disp: , Rfl:  .  Turmeric Curcumin 500 MG CAPS, Take by mouth., Disp: , Rfl:  .  valACYclovir (VALTREX) 500 MG tablet, TAKE 1 TABLET (500 MG TOTAL) BY MOUTH 2 (TWO) TIMES DAILY., Disp: 60 tablet, Rfl: 12 .  benzonatate (TESSALON) 200 MG capsule, Take 1 capsule (200 mg total) by mouth 2 (two) times daily as needed for cough. (Patient not taking: Reported on 08/23/2018), Disp: 20  capsule, Rfl: 0 .  cefdinir (OMNICEF) 300 MG capsule, Take 1 capsule (300 mg total) by mouth 2 (two) times daily. (Patient not taking: Reported on 08/23/2018), Disp: 20 capsule, Rfl: 0 .  predniSONE (DELTASONE) 10 MG tablet, Take 1 tablet (10 mg total) by mouth daily with breakfast. Taper down by one tablet daily (6,5,4,3,2,1) (Patient not taking: Reported on 08/23/2018), Disp: 21 tablet, Rfl: 0  Current Facility-Administered Medications:  .  albuterol (PROVENTIL) (2.5 MG/3ML) 0.083% nebulizer solution 2.5 mg, 2.5 mg, Nebulization, Once, Chrismon, Dennis E, PA  Review of Systems  Constitutional: Positive for activity change. Negative for diaphoresis, fatigue, fever and unexpected weight change.  Eyes: Negative.   Respiratory: Negative for shortness of breath.   Cardiovascular: Negative for chest pain, palpitations and leg swelling.  Endocrine: Negative.   Musculoskeletal: Positive for arthralgias, back pain, myalgias, neck pain and neck stiffness. Negative for gait problem and joint swelling.  Skin: Negative.   Allergic/Immunologic: Negative.   Neurological: Positive for tremors, syncope and numbness. Negative for dizziness, seizures, facial asymmetry, speech difficulty, weakness, light-headedness and headaches.    Social History   Tobacco Use  . Smoking status: Never Smoker  . Smokeless tobacco: Never Used  Substance Use Topics  .  Alcohol use: No    Alcohol/week: 0.0 standard drinks   Objective:   BP (!) 148/72 (BP Location: Right Arm, Patient Position: Sitting, Cuff Size: Large)   Pulse 70   Temp 98.6 F (37 C)   Resp 16   Wt 278 lb (126.1 kg)   SpO2 96%   BMI 39.89 kg/m  Vitals:   08/23/18 1503  BP: (!) 148/72  Pulse: 70  Resp: 16  Temp: 98.6 F (37 C)  SpO2: 96%  Weight: 278 lb (126.1 kg)     Physical Exam  Constitutional: He is oriented to person, place, and time. He appears well-developed and well-nourished.  HENT:  Head: Normocephalic and atraumatic.  Right  Ear: External ear normal.  Left Ear: External ear normal.  Nose: Nose normal.  Eyes: Conjunctivae are normal. No scleral icterus.  Neck: No thyromegaly present.  Cardiovascular: Normal rate, regular rhythm and normal heart sounds.  Pulmonary/Chest: Effort normal and breath sounds normal.  Abdominal: Soft.  Musculoskeletal: He exhibits no edema.  Neurological: He is alert and oriented to person, place, and time.  Skin: Skin is warm and dry.  Psychiatric: He has a normal mood and affect. His behavior is normal. Judgment and thought content normal.        Assessment & Plan:     1. Chronic thoracic back pain, unspecified back pain laterality RTC 2-4 weeks to reassess. - cyclobenzaprine (FLEXERIL) 10 MG tablet; Take 1 tablet (10 mg total) by mouth 3 (three) times daily as needed for muscle spasms.  Dispense: 30 tablet; Refill: 0 - predniSONE (STERAPRED UNI-PAK 48 TAB) 10 MG (48) TBPK tablet; Taper as directed.  Dispense: 48 tablet; Refill: 0  2. Neck pain  - cyclobenzaprine (FLEXERIL) 10 MG tablet; Take 1 tablet (10 mg total) by mouth 3 (three) times daily as needed for muscle spasms.  Dispense: 30 tablet; Refill: 0 - predniSONE (STERAPRED UNI-PAK 48 TAB) 10 MG (48) TBPK tablet; Taper as directed.  Dispense: 48 tablet; Refill: 0  3. Flu vaccine need  - Flu Vaccine QUAD 6+ mos PF IM (Fluarix Quad PF)  I have done the exam and reviewed the chart and it is accurate to the best of my knowledge. Development worker, community has been used and  any errors in dictation or transcription are unintentional. Miguel Aschoff M.D. [place], MD  [facility]

## 2018-08-30 ENCOUNTER — Ambulatory Visit
Admission: RE | Admit: 2018-08-30 | Discharge: 2018-08-30 | Disposition: A | Discharge status: Home / Self Care | Payer: BLUE CROSS/BLUE SHIELD | Source: Ambulatory Visit | Attending: Family Medicine | Admitting: Family Medicine

## 2018-08-30 ENCOUNTER — Ambulatory Visit: Payer: BLUE CROSS/BLUE SHIELD | Admitting: Family Medicine

## 2018-08-30 VITALS — BP 140/76 | HR 88 | Temp 98.0°F | Resp 16 | Wt 281.0 lb

## 2018-08-30 DIAGNOSIS — M5412 Radiculopathy, cervical region: Secondary | ICD-10-CM

## 2018-08-30 DIAGNOSIS — Z981 Arthrodesis status: Secondary | ICD-10-CM | POA: Diagnosis not present

## 2018-08-30 DIAGNOSIS — M4802 Spinal stenosis, cervical region: Secondary | ICD-10-CM | POA: Insufficient documentation

## 2018-08-30 DIAGNOSIS — M50121 Cervical disc disorder at C4-C5 level with radiculopathy: Secondary | ICD-10-CM | POA: Diagnosis not present

## 2018-08-30 DIAGNOSIS — M5416 Radiculopathy, lumbar region: Secondary | ICD-10-CM | POA: Diagnosis present

## 2018-08-30 DIAGNOSIS — R937 Abnormal findings on diagnostic imaging of other parts of musculoskeletal system: Secondary | ICD-10-CM | POA: Diagnosis not present

## 2018-08-30 MED ORDER — METAXALONE 800 MG PO TABS: 800.0000 mg | ORAL_TABLET | ORAL | 2 refills | Status: AC | PRN

## 2018-08-30 NOTE — Progress Notes (Signed)
Maxwell Brown  MRN: [id number] DOB: 1966/08/19  Subjective:  HPI   The patient is a 52 year old male who presents for follow up of back and neck pain.  He was seen on 08/23/18 and treated with muscle relaxer and steroids.  He returns today stating he is not any better.  He was unable to tolerate the Flexoril and he has a few more days of the Prednisone.    Patient Active Problem List   Diagnosis Date Noted  . History of adenomatous polyps of colon 10/07/2016  . Allergic rhinitis 04/17/2015  . Borderline diabetes 04/17/2015  . Adiposity 04/17/2015  . Plantar fasciitis 04/17/2015  . Peptic ulcer 04/17/2015  . HNP (herniated nucleus pulposus), lumbar 01/24/2015  . Gastritis 11/03/2011  . Abdominal pain, epigastric 10/29/2011  . DJD (degenerative joint disease), multiple sites 10/29/2011  . Dermatologic disease 03/30/2010  . Essential (primary) hypertension 05/19/2009  . Esophagitis, reflux 09/03/2008    Past Medical History:  Diagnosis Date  . Arthritis   . Complication of anesthesia    2005 had a 6-7 hr surgery, he had difficulty whenever he got up, would pass out.  heart tests were normal  "anesthesia" related he was told  . Gastritis    last flare up was 3 yrs ago  . GERD (gastroesophageal reflux disease)   . History of adenomatous polyps of colon 10/07/2016    Social History   Socioeconomic History  . Marital status: Married    Spouse name: Not on file  . Number of children: Not on file  . Years of education: Not on file  . Highest education level: Not on file  Occupational History  . Occupation: Museum/gallery conservator  . Financial resource strain: Not on file  . Food insecurity:    Worry: Not on file    Inability: Not on file  . Transportation needs:    Medical: Not on file    Non-medical: Not on file  Tobacco Use  . Smoking status: Never Smoker  . Smokeless tobacco: Never Used  Substance and Sexual Activity  . Alcohol use: No    Alcohol/week: 0.0  standard drinks  . Drug use: No  . Sexual activity: Not on file  Lifestyle  . Physical activity:    Days per week: Not on file    Minutes per session: Not on file  . Stress: Not on file  Relationships  . Social connections:    Talks on phone: Not on file    Gets together: Not on file    Attends religious service: Not on file    Active member of club or organization: Not on file    Attends meetings of clubs or organizations: Not on file    Relationship status: Not on file  . Intimate partner violence:    Fear of current or ex partner: Not on file    Emotionally abused: Not on file    Physically abused: Not on file    Forced sexual activity: Not on file  Other Topics Concern  . Not on file  Social History Narrative  . Not on file    Outpatient Encounter Medications as of 08/30/2018  Medication Sig  . albuterol (PROVENTIL HFA;VENTOLIN HFA) 108 (90 Base) MCG/ACT inhaler Inhale 2 puffs into the lungs every 6 (six) hours as needed for wheezing or shortness of breath.  . celecoxib (CELEBREX) 200 MG capsule TAKE 1 CAPSULE BY MOUTH EVERY DAY  . diphenhydrAMINE (ALLERGY RELIEF) 25  MG tablet Take 25 mg by mouth every 6 (six) hours as needed.  . predniSONE (STERAPRED UNI-PAK 48 TAB) 10 MG (48) TBPK tablet Taper as directed.  . RaNITidine HCl (ZANTAC 75 PO) Take 1 tablet by mouth 2 (two) times daily. Not sure of the mg dose   . Turmeric Curcumin 500 MG CAPS Take by mouth.  . valACYclovir (VALTREX) 500 MG tablet TAKE 1 TABLET (500 MG TOTAL) BY MOUTH 2 (TWO) TIMES DAILY.  . cyclobenzaprine (FLEXERIL) 10 MG tablet Take 1 tablet (10 mg total) by mouth 3 (three) times daily as needed for muscle spasms. (Patient not taking: Reported on 08/30/2018)  . [DISCONTINUED] benzonatate (TESSALON) 200 MG capsule Take 1 capsule (200 mg total) by mouth 2 (two) times daily as needed for cough. (Patient not taking: Reported on 08/23/2018)  . [DISCONTINUED] cefdinir (OMNICEF) 300 MG capsule Take 1 capsule (300 mg  total) by mouth 2 (two) times daily. (Patient not taking: Reported on 08/23/2018)  . [DISCONTINUED] predniSONE (DELTASONE) 10 MG tablet Take 1 tablet (10 mg total) by mouth daily with breakfast. Taper down by one tablet daily (6,5,4,3,2,1) (Patient not taking: Reported on 08/23/2018)   Facility-Administered Encounter Medications as of 08/30/2018  Medication  . albuterol (PROVENTIL) (2.5 MG/3ML) 0.083% nebulizer solution 2.5 mg    Allergies  Allergen Reactions  . Other     Pt stated he had a surgery on 10/19/2004, was under deep anaesthesia, did well waking up, but had continuous episodes of passing out when trying to stand.   . Betadine [Povidone Iodine] Hives and Rash    Blistered skin     Review of Systems  Constitutional: Negative.   Respiratory: Negative for cough and shortness of breath.   Cardiovascular: Negative for chest pain, palpitations, orthopnea and leg swelling.  Musculoskeletal: Positive for back pain, myalgias and neck pain.  Neurological: Positive for weakness.  Psychiatric/Behavioral: Negative.     Objective:  BP 140/76 (BP Location: Right Arm, Patient Position: Sitting, Cuff Size: Large)   Pulse 88   Temp 98 F (36.7 C) (Oral)   Resp 16   Wt 281 lb (127.5 kg)   SpO2 99%   BMI 40.32 kg/m   Physical Exam  Constitutional: He is oriented to person, place, and time and well-developed, well-nourished, and in no distress.  HENT:  Head: Normocephalic and atraumatic.  Right Ear: External ear normal.  Left Ear: External ear normal.  Nose: Nose normal.  Eyes: Conjunctivae are normal. No scleral icterus.  Neck: No thyromegaly present.  Cardiovascular: Normal rate, regular rhythm and normal heart sounds.  Pulmonary/Chest: Effort normal.  Musculoskeletal: He exhibits no edema.  Neurological: He is alert and oriented to person, place, and time. Gait normal. GCS score is 15.  Right foot drop and mild right arm weakness.  Skin: Skin is warm and dry.  Psychiatric: Mood,  memory, affect and judgment normal.    Assessment and Plan :  1. Cervical radiculopathy  - metaxalone (SKELAXIN) 800 MG tablet; Take 1 tablet (800 mg total) by mouth every 4 (four) hours as needed for muscle spasms.  Dispense: 120 tablet; Refill: 2 - DG Cervical Spine Complete; Future - MR Cervical Spine Wo Contrast; Future - Ambulatory referral to Neurosurgery  2. 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 - metaxalone (SKELAXIN) 800 MG tablet; Take 1 tablet (800 mg total) by mouth every 4 (four) hours as needed  for muscle spasms.  Dispense: 120 tablet; Refill: 2 - DG Lumbar Spine Complete; Future - MR Lumbar Spine Wo Contrast; Future - Ambulatory referral to Neurosurgery  I have done the exam and reviewed the chart and it is accurate to the best of my knowledge. Development worker, community has been used and  any errors in dictation or transcription are unintentional. Miguel Aschoff M.D. [facility]

## 2018-08-31 ENCOUNTER — Telehealth: Payer: Self-pay

## 2018-08-31 ENCOUNTER — Telehealth: Payer: Self-pay | Admitting: Family Medicine

## 2018-08-31 DIAGNOSIS — M5412 Radiculopathy, cervical region: Secondary | ICD-10-CM

## 2018-08-31 DIAGNOSIS — M5416 Radiculopathy, lumbar region: Secondary | ICD-10-CM

## 2018-08-31 NOTE — Telephone Encounter (Signed)
Pt's insurance company would like a peer to peer to get bot MRI of cervical spine and MRI of lumbar spine approved.Member # [id number].Phone [phone]

## 2018-08-31 NOTE — Telephone Encounter (Signed)
-----   Message from Jerrol Banana., MD sent at 08/31/2018  8:01 AM EDT ----- No new changes--refer back to neurosurgery--this should already be done.

## 2018-08-31 NOTE — Telephone Encounter (Signed)
[facility]  ED   ----- Message from Jerrol Banana., MD sent at 08/31/2018  8:01 AM EDT ----- No new changes--refer back to neurosurgery--this should already be done.

## 2018-08-31 NOTE — Telephone Encounter (Signed)
Please reveiw

## 2018-09-01 NOTE — Telephone Encounter (Signed)
Advised  ED 

## 2018-09-15 NOTE — Telephone Encounter (Signed)
Please review. Thanks!  

## 2018-09-15 NOTE — Telephone Encounter (Signed)
First thing Monday reorder MRI please.

## 2018-09-15 NOTE — Telephone Encounter (Unsigned)
Copied from [place] [id number]. Topic: Referral - Status >> Sep 15, 2018  8:51 AM Parke Poisson wrote: Reason for CRM: We have tried to get pt into 3 different neurosurgeons office but none will take him as a pt because there is not a recent MRI. One was ordered that went peer to peer and was denied.Pt states he is still in a lot of pain and wants to know what next step is

## 2018-09-18 ENCOUNTER — Telehealth: Payer: Self-pay | Admitting: Family Medicine

## 2018-09-18 NOTE — Telephone Encounter (Signed)
Pt wanting Jiles Garter to call him back to discuss some ongoing issues.  Please advise.  Thanks, [employer]

## 2018-09-18 NOTE — Telephone Encounter (Signed)
Both MRI was re ordered.

## 2018-09-18 NOTE — Telephone Encounter (Signed)
Patient called to check the status of the MRI.  Advised that it has been ordered again with the hope that they will approve it this time.

## 2018-09-22 ENCOUNTER — Ambulatory Visit (HOSPITAL_COMMUNITY)
Admission: RE | Admit: 2018-09-22 | Discharge: 2018-09-22 | Disposition: A | Discharge status: Home / Self Care | Payer: BLUE CROSS/BLUE SHIELD | Source: Ambulatory Visit | Attending: Family Medicine | Admitting: Family Medicine

## 2018-09-22 DIAGNOSIS — M4802 Spinal stenosis, cervical region: Secondary | ICD-10-CM | POA: Diagnosis not present

## 2018-09-22 DIAGNOSIS — M5116 Intervertebral disc disorders with radiculopathy, lumbar region: Secondary | ICD-10-CM | POA: Diagnosis not present

## 2018-09-22 DIAGNOSIS — M5412 Radiculopathy, cervical region: Secondary | ICD-10-CM | POA: Diagnosis present

## 2018-09-22 DIAGNOSIS — M4722 Other spondylosis with radiculopathy, cervical region: Secondary | ICD-10-CM | POA: Insufficient documentation

## 2018-09-22 DIAGNOSIS — M5416 Radiculopathy, lumbar region: Secondary | ICD-10-CM

## 2018-09-22 DIAGNOSIS — M48061 Spinal stenosis, lumbar region without neurogenic claudication: Secondary | ICD-10-CM | POA: Diagnosis not present

## 2018-09-26 ENCOUNTER — Telehealth: Payer: Self-pay | Admitting: Family Medicine

## 2018-09-26 NOTE — Telephone Encounter (Signed)
Maxwell Brown called Ernst Bowler to let her know that he seen the neurosurgeon today and he has a treatment plan in effect.  He will be following up with Neuro.

## 2018-09-26 NOTE — Telephone Encounter (Signed)
Awesome!

## 2018-10-16 ENCOUNTER — Encounter: Payer: BLUE CROSS/BLUE SHIELD | Admitting: Family Medicine

## 2018-10-25 ENCOUNTER — Other Ambulatory Visit: Payer: Self-pay | Admitting: Family Medicine

## 2018-12-04 ENCOUNTER — Encounter: Payer: Self-pay | Admitting: Family Medicine

## 2019-01-30 ENCOUNTER — Ambulatory Visit (INDEPENDENT_AMBULATORY_CARE_PROVIDER_SITE_OTHER): Payer: PRIVATE HEALTH INSURANCE | Admitting: Family Medicine

## 2019-01-30 ENCOUNTER — Encounter: Payer: Self-pay | Admitting: Family Medicine

## 2019-01-30 VITALS — BP 118/76 | HR 86 | Temp 98.0°F | Resp 16 | Ht 70.0 in | Wt 287.0 lb

## 2019-01-30 DIAGNOSIS — Z125 Encounter for screening for malignant neoplasm of prostate: Secondary | ICD-10-CM | POA: Diagnosis not present

## 2019-01-30 DIAGNOSIS — J309 Allergic rhinitis, unspecified: Secondary | ICD-10-CM | POA: Diagnosis not present

## 2019-01-30 DIAGNOSIS — Z0000 Encounter for general adult medical examination without abnormal findings: Secondary | ICD-10-CM | POA: Diagnosis not present

## 2019-01-30 DIAGNOSIS — L570 Actinic keratosis: Secondary | ICD-10-CM

## 2019-01-30 DIAGNOSIS — Z6841 Body Mass Index (BMI) 40.0 and over, adult: Secondary | ICD-10-CM

## 2019-01-30 DIAGNOSIS — M5030 Other cervical disc degeneration, unspecified cervical region: Secondary | ICD-10-CM

## 2019-01-30 DIAGNOSIS — M5137 Other intervertebral disc degeneration, lumbosacral region: Secondary | ICD-10-CM

## 2019-01-30 DIAGNOSIS — M150 Primary generalized (osteo)arthritis: Secondary | ICD-10-CM

## 2019-01-30 DIAGNOSIS — M159 Polyosteoarthritis, unspecified: Secondary | ICD-10-CM

## 2019-01-30 LAB — POCT URINALYSIS DIPSTICK
Bilirubin, UA: NEGATIVE
Blood, UA: NEGATIVE
Glucose, UA: NEGATIVE
Ketones, UA: NEGATIVE
Nitrite, UA: NEGATIVE
PH UA: 6.5 (ref 5.0–8.0)
Protein, UA: NEGATIVE
Spec Grav, UA: 1.02 (ref 1.010–1.025)
Urobilinogen, UA: 0.2 E.U./dL
WBC, UA: NEGATIVE

## 2019-01-30 MED ORDER — FLUTICASONE PROPIONATE 50 MCG/ACT NA SUSP: 2.0000 | Freq: Every day | NASAL | 6 refills | Status: DC

## 2019-01-30 NOTE — Progress Notes (Signed)
Patient: Maxwell Brown, Male    DOB: Jan 16, 1966, 53 y.o.   MRN: [id number] Visit Date: 01/30/2019  Today's Provider: [name], MD   Chief Complaint  Patient presents with  . Annual Exam   Subjective:     Annual physical exam Maxwell Brown is a 53 y.o. male who presents today for health maintenance and complete physical. He feels well. He reports exercising not regularly, but he occasionally does exercises that he learned from physical therapy . He reports he is sleeping poorly due to lower back and neck pain. His neck pain and low back pain are slowly getting worse. He does complain of worsening ongoing chronic nasal congestion. He has a recent problem with external hemorrhoids which have flared up.  His mother died 01-29-2023 after a lengthy illness.  Colonoscopy- 10/04/2016. 2 polyps. Tubular adenoma. Repeat 09/2021.  Tdap- 01/29/2011.     Review of Systems  Constitutional: Negative.   HENT: Negative.   Eyes: Negative.   Respiratory: Negative.   Cardiovascular: Negative.   Gastrointestinal: Negative.   Endocrine: Negative.   Genitourinary: Negative.   Musculoskeletal: Positive for arthralgias, back pain, myalgias, neck pain and neck stiffness.  Skin: Negative.   Allergic/Immunologic: Negative.   Neurological: Negative.   Hematological: Negative.   Psychiatric/Behavioral: Negative.     Social History      He  reports that he has never smoked. He has never used smokeless tobacco. He reports that he does not drink alcohol or use drugs.       Social History   Socioeconomic History  . Marital status: Married    Spouse name: Not on file  . Number of children: Not on file  . Years of education: Not on file  . Highest education level: Not on file  Occupational History  . Occupation: Museum/gallery conservator  . Financial resource strain: Not on file  . Food insecurity:    Worry: Not on file    Inability: Not on file  . Transportation needs:   Medical: Not on file    Non-medical: Not on file  Tobacco Use  . Smoking status: Never Smoker  . Smokeless tobacco: Never Used  Substance and Sexual Activity  . Alcohol use: No    Alcohol/week: 0.0 standard drinks  . Drug use: No  . Sexual activity: Not on file  Lifestyle  . Physical activity:    Days per week: Not on file    Minutes per session: Not on file  . Stress: Not on file  Relationships  . Social connections:    Talks on phone: Not on file    Gets together: Not on file    Attends religious service: Not on file    Active member of club or organization: Not on file    Attends meetings of clubs or organizations: Not on file    Relationship status: Not on file  Other Topics Concern  . Not on file  Social History Narrative  . Not on file    Past Medical History:  Diagnosis Date  . Arthritis   . Complication of anesthesia    2005 had a 6-7 hr surgery, he had difficulty whenever he got up, would pass out.  heart tests were normal  "anesthesia" related he was told  . Gastritis    last flare up was 3 yrs ago  . GERD (gastroesophageal reflux disease)   . History of adenomatous polyps of colon 10/07/2016  Patient Active Problem List   Diagnosis Date Noted  . History of adenomatous polyps of colon 10/07/2016  . Allergic rhinitis 04/17/2015  . Borderline diabetes 04/17/2015  . Adiposity 04/17/2015  . Plantar fasciitis 04/17/2015  . Peptic ulcer 04/17/2015  . HNP (herniated nucleus pulposus), lumbar 01/24/2015  . Gastritis 11/03/2011  . Abdominal pain, epigastric 10/29/2011  . DJD (degenerative joint disease), multiple sites 10/29/2011  . Dermatologic disease 03/30/2010  . Essential (primary) hypertension 05/19/2009  . Esophagitis, reflux 09/03/2008    Past Surgical History:  Procedure Laterality Date  . BACK SURGERY    . LUMBAR FUSION     L3, L4 2016  . NECK SURGERY    . UPPER GI ENDOSCOPY      Family History        Family Status  Relation Name  Status  . Mother  Alive  . Father  Deceased  . Brother  Deceased  . Brother  Alive  . Brother  Alive  . Neg Hx  (Not Specified)        His family history includes Bone cancer in his father; Diabetes in his mother; Heart disease in his mother; Lung cancer in his father; Transient ischemic attack in his mother. There is no history of Colon cancer.      Allergies  Allergen Reactions  . Other     Pt stated he had a surgery on 10/19/2004, was under deep anaesthesia, did well waking up, but had continuous episodes of passing out when trying to stand.   . Betadine [Povidone Iodine] Hives and Rash    Blistered skin      Current Outpatient Medications:  .  celecoxib (CELEBREX) 200 MG capsule, TAKE 1 CAPSULE BY MOUTH EVERY DAY, Disp: 90 capsule, Rfl: 1 .  diphenhydrAMINE (ALLERGY RELIEF) 25 MG tablet, Take 25 mg by mouth every 6 (six) hours as needed., Disp: , Rfl:  .  RaNITidine HCl (ZANTAC 75 PO), Take 1 tablet by mouth 2 (two) times daily. Not sure of the mg dose , Disp: , Rfl:  .  Turmeric Curcumin 500 MG CAPS, Take by mouth., Disp: , Rfl:  .  valACYclovir (VALTREX) 500 MG tablet, TAKE 1 TABLET (500 MG TOTAL) BY MOUTH 2 (TWO) TIMES DAILY., Disp: 60 tablet, Rfl: 12 .  albuterol (PROVENTIL HFA;VENTOLIN HFA) 108 (90 Base) MCG/ACT inhaler, Inhale 2 puffs into the lungs every 6 (six) hours as needed for wheezing or shortness of breath. (Patient not taking: Reported on 01/30/2019), Disp: 1 Inhaler, Rfl: 2 .  cyclobenzaprine (FLEXERIL) 10 MG tablet, Take 1 tablet (10 mg total) by mouth 3 (three) times daily as needed for muscle spasms. (Patient not taking: Reported on 08/30/2018), Disp: 30 tablet, Rfl: 0 .  metaxalone (SKELAXIN) 800 MG tablet, Take 1 tablet (800 mg total) by mouth every 4 (four) hours as needed for muscle spasms. (Patient not taking: Reported on 01/30/2019), Disp: 120 tablet, Rfl: 2 .  predniSONE (STERAPRED UNI-PAK 48 TAB) 10 MG (48) TBPK tablet, Taper as directed. (Patient not taking:  Reported on 01/30/2019), Disp: 48 tablet, Rfl: 0  Current Facility-Administered Medications:  .  albuterol (PROVENTIL) (2.5 MG/3ML) 0.083% nebulizer solution 2.5 mg, 2.5 mg, Nebulization, Once, Chrismon, Vickki Muff, PA   Patient Care Team: Jerrol Banana., MD as PCP - General (Unknown Physician Specialty)    Objective:    Vitals: BP 118/76 (BP Location: Right Arm, Patient Position: Sitting, Cuff Size: Large)   Pulse 86   Temp 98 F (36.7  C)   Resp 16   Ht 5\' 10"  (1.778 m)   Wt 287 lb (130.2 kg)   SpO2 98%   BMI 41.18 kg/m    Vitals:   01/30/19 1407  BP: 118/76  Pulse: 86  Resp: 16  Temp: 98 F (36.7 C)  SpO2: 98%  Weight: 287 lb (130.2 kg)  Height: 5\' 10"  (1.778 m)     Physical Exam Constitutional:      Appearance: Normal appearance. He is well-developed.  HENT:     Head: Normocephalic and atraumatic.     Right Ear: Tympanic membrane and external ear normal.     Left Ear: Tympanic membrane and external ear normal.     Nose: Nose normal.     Mouth/Throat:     Pharynx: Oropharynx is clear.  Eyes:     General: No scleral icterus.    Conjunctiva/sclera: Conjunctivae normal.  Neck:     Thyroid: No thyromegaly.  Cardiovascular:     Rate and Rhythm: Normal rate and regular rhythm.     Heart sounds: Normal heart sounds.  Pulmonary:     Effort: Pulmonary effort is normal.     Breath sounds: Normal breath sounds.  Abdominal:     General: Abdomen is flat.     Palpations: Abdomen is soft.  Musculoskeletal:     Right lower leg: No edema.     Left lower leg: No edema.  Lymphadenopathy:     Cervical: No cervical adenopathy.  Skin:    General: Skin is warm and dry.  Neurological:     General: No focal deficit present.     Mental Status: He is alert and oriented to person, place, and time.  Psychiatric:        Mood and Affect: Mood normal.        Behavior: Behavior normal.        Thought Content: Thought content normal.        Judgment: Judgment normal.       Depression Screen PHQ 2/9 Scores 01/30/2019 08/30/2018 07/25/2017  PHQ - 2 Score 0 0 0  PHQ- 9 Score - - 4       Assessment & Plan:     Routine Health Maintenance and Physical Exam  Exercise Activities and Dietary recommendations Goals   None     Immunization History  Administered Date(s) Administered  . Influenza Inj Mdck Quad Pf 09/12/2017  . Influenza,inj,Quad PF,6+ Mos 09/13/2016, 08/23/2018  . Influenza-Unspecified 09/12/2015  . Tdap 01/04/2011    Health Maintenance  Topic Date Due  . HIV Screening  05/01/1981  . TETANUS/TDAP  01/04/2021  . COLONOSCOPY  10/04/2021  . INFLUENZA VACCINE  Completed     Discussed health benefits of physical activity, and encouraged him to engage in regular exercise appropriate for his age and condition.  1. Annual physical exam  - CBC with Differential/Platelet - Comprehensive metabolic panel - Lipid panel - TSH - POCT urinalysis dipstick  2. Prostate cancer screening  - PSA  3. Allergic rhinitis, unspecified seasonality, unspecified trigger  - fluticasone (FLONASE) 50 MCG/ACT nasal spray; Place 2 sprays into both nostrils daily.  Dispense: 16 g; Refill: 6  4. AK (actinic keratosis) By Dr. Evorn Gong.  He has multiple AK's on his head - Ambulatory referral to Dermatology  5. Primary osteoarthritis involving multiple joints   6. Class 3 severe obesity due to excess calories with serious comorbidity and body mass index (BMI) of 40.0 to 44.9 in adult [facility]) With degenerative  disc disease and degenerative  joint disease.  7. DDD (degenerative disc disease), cervical   8. DDD (degenerative disc disease), lumbosacral  9.hemorrhoids  I have done the exam and reviewed the above chart and it is accurate to the best of my knowledge. Development worker, community has been used in this note in any air is in the dictation or transcription are unintentional.  [name], MD  [place]

## 2019-01-31 LAB — CBC WITH DIFFERENTIAL/PLATELET
BASOS: 1 %
Basophils Absolute: 0 10*3/uL (ref 0.0–0.2)
EOS (ABSOLUTE): 0.2 10*3/uL (ref 0.0–0.4)
EOS: 2 %
HEMATOCRIT: 44.7 % (ref 37.5–51.0)
Hemoglobin: 15 g/dL (ref 13.0–17.7)
Immature Grans (Abs): 0 10*3/uL (ref 0.0–0.1)
Immature Granulocytes: 0 %
Lymphocytes Absolute: 2.6 10*3/uL (ref 0.7–3.1)
Lymphs: 30 %
MCH: 30.7 pg (ref 26.6–33.0)
MCHC: 33.6 g/dL (ref 31.5–35.7)
MCV: 91 fL (ref 79–97)
MONOS ABS: 0.8 10*3/uL (ref 0.1–0.9)
Monocytes: 9 %
Neutrophils Absolute: 5 10*3/uL (ref 1.4–7.0)
Neutrophils: 58 %
Platelets: 213 10*3/uL (ref 150–450)
RBC: 4.89 x10E6/uL (ref 4.14–5.80)
RDW: 13.6 % (ref 11.6–15.4)
WBC: 8.7 10*3/uL (ref 3.4–10.8)

## 2019-01-31 LAB — COMPREHENSIVE METABOLIC PANEL
A/G RATIO: 2 (ref 1.2–2.2)
ALT: 18 IU/L (ref 0–44)
AST: 13 IU/L (ref 0–40)
Albumin: 4.5 g/dL (ref 3.8–4.9)
Alkaline Phosphatase: 72 IU/L (ref 39–117)
BILIRUBIN TOTAL: 0.7 mg/dL (ref 0.0–1.2)
BUN/Creatinine Ratio: 11 (ref 9–20)
BUN: 9 mg/dL (ref 6–24)
CO2: 26 mmol/L (ref 20–29)
CREATININE: 0.85 mg/dL (ref 0.76–1.27)
Calcium: 9.4 mg/dL (ref 8.7–10.2)
Chloride: 103 mmol/L (ref 96–106)
GFR calc Af Amer: 116 mL/min/{1.73_m2} (ref >59)
GFR calc non Af Amer: 100 mL/min/{1.73_m2} (ref >59)
GLOBULIN, TOTAL: 2.2 g/dL (ref 1.5–4.5)
Glucose: 79 mg/dL (ref 65–99)
Potassium: 4 mmol/L (ref 3.5–5.2)
Sodium: 142 mmol/L (ref 134–144)
Total Protein: 6.7 g/dL (ref 6.0–8.5)

## 2019-01-31 LAB — TSH: TSH: 1.54 u[IU]/mL (ref 0.450–4.500)

## 2019-01-31 LAB — LIPID PANEL
Chol/HDL Ratio: 4.3 ratio (ref 0.0–5.0)
Cholesterol, Total: 192 mg/dL (ref 100–199)
HDL: 45 mg/dL (ref >39)
LDL CALC: 104 mg/dL — AB (ref 0–99)
TRIGLYCERIDES: 216 mg/dL — AB (ref 0–149)
VLDL CHOLESTEROL CAL: 43 mg/dL — AB (ref 5–40)

## 2019-01-31 LAB — PSA: Prostate Specific Ag, Serum: 1.1 ng/mL (ref 0.0–4.0)

## 2019-02-01 ENCOUNTER — Telehealth: Payer: Self-pay

## 2019-02-01 NOTE — Telephone Encounter (Signed)
Left message to call back  

## 2019-02-01 NOTE — Telephone Encounter (Signed)
-----   Message from Jerrol Banana., MD sent at 02/01/2019  8:23 AM EST ----- Labs stable.

## 2019-02-05 NOTE — Telephone Encounter (Signed)
Pt advised of lab results.  

## 2019-02-24 ENCOUNTER — Other Ambulatory Visit: Payer: Self-pay | Admitting: Family Medicine

## 2019-02-24 DIAGNOSIS — M1711 Unilateral primary osteoarthritis, right knee: Secondary | ICD-10-CM

## 2019-09-20 ENCOUNTER — Other Ambulatory Visit: Payer: Self-pay | Admitting: Family Medicine

## 2019-09-20 DIAGNOSIS — J309 Allergic rhinitis, unspecified: Secondary | ICD-10-CM

## 2019-11-30 ENCOUNTER — Other Ambulatory Visit: Payer: Self-pay | Admitting: Family Medicine

## 2020-02-04 ENCOUNTER — Encounter: Payer: Self-pay | Admitting: Family Medicine

## 2020-02-19 NOTE — Progress Notes (Signed)
Patient: Maxwell Brown, Male    DOB: 1966-02-03, 54 y.o.   MRN: [id number] Visit Date: 02/20/2020  Today's Provider: [name], MD   Chief Complaint  Patient presents with  . Annual Exam   Subjective:     Annual physical exam Maxwell Brown is a 54 y.o. male who presents today for health maintenance and complete physical. He feels fairly well. He reports he is not exercising. He reports he is sleeping poorly.  He is married with no children.  He is disabled from chronic back pain neck pain with several surgeries for same.  -----------------------------------------------------------------  Colonoscopy: 10/04/2016  Review of Systems  Constitutional: Positive for activity change and fatigue.  HENT: Negative.   Eyes: Negative.   Respiratory: Negative.   Cardiovascular: Positive for leg swelling.       Lower legs swell  at the end of the day.  Gastrointestinal: Negative.   Endocrine: Negative.   Genitourinary: Negative.   Musculoskeletal: Positive for arthralgias, back pain, joint swelling, myalgias, neck pain and neck stiffness.  Skin: Negative.   Allergic/Immunologic: Negative.   Neurological: Positive for numbness.  Hematological: Negative.   Psychiatric/Behavioral: Positive for sleep disturbance.    Social History      He  reports that he has never smoked. He has never used smokeless tobacco. He reports that he does not drink alcohol or use drugs.       Social History   Socioeconomic History  . Marital status: Married    Spouse name: Not on file  . Number of children: Not on file  . Years of education: Not on file  . Highest education level: Not on file  Occupational History  . Occupation: Set designer  Tobacco Use  . Smoking status: Never Smoker  . Smokeless tobacco: Never Used  Substance and Sexual Activity  . Alcohol use: No    Alcohol/week: 0.0 standard drinks  . Drug use: No  . Sexual activity: Not on file  Other Topics Concern  . Not  on file  Social History Narrative  . Not on file   Social Determinants of Health   Financial Resource Strain:   . Difficulty of Paying Living Expenses: Not on file  Food Insecurity:   . Worried About Charity fundraiser in the Last Year: Not on file  . Ran Out of Food in the Last Year: Not on file  Transportation Needs:   . Lack of Transportation (Medical): Not on file  . Lack of Transportation (Non-Medical): Not on file  Physical Activity:   . Days of Exercise per Week: Not on file  . Minutes of Exercise per Session: Not on file  Stress:   . Feeling of Stress : Not on file  Social Connections:   . Frequency of Communication with Friends and Family: Not on file  . Frequency of Social Gatherings with Friends and Family: Not on file  . Attends Religious Services: Not on file  . Active Member of Clubs or Organizations: Not on file  . Attends Archivist Meetings: Not on file  . Marital Status: Not on file    Past Medical History:  Diagnosis Date  . Arthritis   . Complication of anesthesia    2005 had a 6-7 hr surgery, he had difficulty whenever he got up, would pass out.  heart tests were normal  "anesthesia" related he was told  . Gastritis    last flare up was 3  yrs ago  . GERD (gastroesophageal reflux disease)   . History of adenomatous polyps of colon 10/07/2016     Patient Active Problem List   Diagnosis Date Noted  . History of adenomatous polyps of colon 10/07/2016  . Allergic rhinitis 04/17/2015  . Borderline diabetes 04/17/2015  . Adiposity 04/17/2015  . Plantar fasciitis 04/17/2015  . Peptic ulcer 04/17/2015  . HNP (herniated nucleus pulposus), lumbar 01/24/2015  . Gastritis 11/03/2011  . Abdominal pain, epigastric 10/29/2011  . DJD (degenerative joint disease), multiple sites 10/29/2011  . Dermatologic disease 03/30/2010  . Essential (primary) hypertension 05/19/2009  . Esophagitis, reflux 09/03/2008    Past Surgical History:  Procedure  Laterality Date  . BACK SURGERY    . LUMBAR FUSION     L3, L4 2016  . NECK SURGERY    . UPPER GI ENDOSCOPY      Family History        Family Status  Relation Name Status  . Mother  Alive  . Father  Deceased  . Brother  Deceased  . Brother  Alive  . Brother  Alive  . Neg Hx  (Not Specified)        His family history includes Bone cancer in his father; Diabetes in his mother; Heart disease in his mother; Lung cancer in his father; Transient ischemic attack in his mother. There is no history of Colon cancer.      Allergies  Allergen Reactions  . Other     Pt stated he had a surgery on 10/19/2004, was under deep anaesthesia, did well waking up, but had continuous episodes of passing out when trying to stand.   . Betadine [Povidone Iodine] Hives and Rash    Blistered skin      Current Outpatient Medications:  .  albuterol (PROVENTIL HFA;VENTOLIN HFA) 108 (90 Base) MCG/ACT inhaler, Inhale 2 puffs into the lungs every 6 (six) hours as needed for wheezing or shortness of breath. (Patient not taking: Reported on 01/30/2019), Disp: 1 Inhaler, Rfl: 2 .  celecoxib (CELEBREX) 200 MG capsule, TAKE 1 CAPSULE BY MOUTH EVERY DAY, Disp: 90 capsule, Rfl: 1 .  cyclobenzaprine (FLEXERIL) 10 MG tablet, Take 1 tablet (10 mg total) by mouth 3 (three) times daily as needed for muscle spasms. (Patient not taking: Reported on 08/30/2018), Disp: 30 tablet, Rfl: 0 .  diphenhydrAMINE (ALLERGY RELIEF) 25 MG tablet, Take 25 mg by mouth every 6 (six) hours as needed., Disp: , Rfl:  .  fluticasone (FLONASE) 50 MCG/ACT nasal spray, SPRAY 2 SPRAYS INTO EACH NOSTRIL EVERY DAY, Disp: 16 mL, Rfl: 6 .  metaxalone (SKELAXIN) 800 MG tablet, Take 1 tablet (800 mg total) by mouth every 4 (four) hours as needed for muscle spasms. (Patient not taking: Reported on 01/30/2019), Disp: 120 tablet, Rfl: 2 .  predniSONE (STERAPRED UNI-PAK 48 TAB) 10 MG (48) TBPK tablet, Taper as directed. (Patient not taking: Reported on 01/30/2019),  Disp: 48 tablet, Rfl: 0 .  RaNITidine HCl (ZANTAC 75 PO), Take 1 tablet by mouth 2 (two) times daily. Not sure of the mg dose , Disp: , Rfl:  .  Turmeric Curcumin 500 MG CAPS, Take by mouth., Disp: , Rfl:  .  valACYclovir (VALTREX) 500 MG tablet, TAKE 1 TABLET (500 MG TOTAL) BY MOUTH 2 (TWO) TIMES DAILY., Disp: 60 tablet, Rfl: 2  Current Facility-Administered Medications:  .  albuterol (PROVENTIL) (2.5 MG/3ML) 0.083% nebulizer solution 2.5 mg, 2.5 mg, Nebulization, Once, Chrismon, Vickki Muff, PA   Patient Care  Team: Jerrol Banana., MD as PCP - General (Unknown Physician Specialty)    Objective:    Vitals: There were no vitals taken for this visit.  There were no vitals filed for this visit.   Physical Exam Vitals reviewed.  Constitutional:      Appearance: Normal appearance. He is well-developed.  HENT:     Head: Normocephalic and atraumatic.     Right Ear: Tympanic membrane and external ear normal.     Left Ear: Tympanic membrane and external ear normal.     Nose: Nose normal.     Mouth/Throat:     Pharynx: Oropharynx is clear.  Eyes:     General: No scleral icterus.    Conjunctiva/sclera: Conjunctivae normal.  Neck:     Thyroid: No thyromegaly.  Cardiovascular:     Rate and Rhythm: Normal rate and regular rhythm.     Heart sounds: Normal heart sounds.  Pulmonary:     Effort: Pulmonary effort is normal.     Breath sounds: Normal breath sounds.  Abdominal:     General: Abdomen is flat.     Palpations: Abdomen is soft.  Genitourinary:    Penis: Normal.      Testes: Normal.  Musculoskeletal:     Comments: He has trace bilateral edema of lower extremity  Lymphadenopathy:     Cervical: No cervical adenopathy.  Skin:    General: Skin is warm and dry.  Neurological:     General: No focal deficit present.     Mental Status: He is alert and oriented to person, place, and time.  Psychiatric:        Mood and Affect: Mood normal.        Behavior: Behavior normal.         Thought Content: Thought content normal.        Judgment: Judgment normal.      Depression Screen PHQ 2/9 Scores 01/30/2019 08/30/2018 07/25/2017  PHQ - 2 Score 0 0 0  PHQ- 9 Score - - 4       Assessment & Plan:     Routine Health Maintenance and Physical Exam  Exercise Activities and Dietary recommendations Goals   None     Immunization History  Administered Date(s) Administered  . Influenza Inj Mdck Quad Pf 09/12/2017  . Influenza,inj,Quad PF,6+ Mos 09/13/2016, 08/23/2018  . Influenza-Unspecified 09/12/2015  . Tdap 01/04/2011    Health Maintenance  Topic Date Due  . HIV Screening  05/01/1981  . TETANUS/TDAP  01/04/2021  . COLONOSCOPY  10/04/2021  . INFLUENZA VACCINE  Completed     Discussed health benefits of physical activity, and encouraged him to engage in regular exercise appropriate for his age and condition.    1. Annual physical exam  - Comprehensive metabolic panel - CBC with Differential/Platelet - TSH - Lipid panel - PSA  2. Fatigue, unspecified type Discussed etiology.  Patient is very inactive now because of his chronic pain.  Testosterone being low is a possibility also.  Routine labs ordered. - Comprehensive metabolic panel - CBC with Differential/Platelet - TSH - Lipid panel - PSA - Testosterone  3. Essential (primary) hypertension  - Comprehensive metabolic panel - CBC with Differential/Platelet - TSH - Lipid panel - PSA  4. Borderline diabetes  - Comprehensive metabolic panel - CBC with Differential/Platelet - TSH - Lipid panel - PSA  5. DDD (degenerative disc disease), cervical Severe  6. DDD (degenerative disc disease), lumbosacral Severe.  Stop OTC Aleve and try Celebrex  200 mg daily and reassess in a month or 2.  --------------------------------------------------------------------    [name], MD  [facility]

## 2020-02-20 ENCOUNTER — Other Ambulatory Visit: Payer: Self-pay

## 2020-02-20 ENCOUNTER — Encounter: Payer: Self-pay | Admitting: Family Medicine

## 2020-02-20 ENCOUNTER — Ambulatory Visit (INDEPENDENT_AMBULATORY_CARE_PROVIDER_SITE_OTHER): Payer: 59 | Admitting: Family Medicine

## 2020-02-20 VITALS — BP 121/79 | HR 89 | Temp 97.1°F | Ht 70.0 in | Wt 293.4 lb

## 2020-02-20 DIAGNOSIS — R7303 Prediabetes: Secondary | ICD-10-CM

## 2020-02-20 DIAGNOSIS — I10 Essential (primary) hypertension: Secondary | ICD-10-CM | POA: Diagnosis not present

## 2020-02-20 DIAGNOSIS — R5383 Other fatigue: Secondary | ICD-10-CM | POA: Diagnosis not present

## 2020-02-20 DIAGNOSIS — M5030 Other cervical disc degeneration, unspecified cervical region: Secondary | ICD-10-CM

## 2020-02-20 DIAGNOSIS — M1711 Unilateral primary osteoarthritis, right knee: Secondary | ICD-10-CM

## 2020-02-20 DIAGNOSIS — M5137 Other intervertebral disc degeneration, lumbosacral region: Secondary | ICD-10-CM

## 2020-02-20 DIAGNOSIS — Z0000 Encounter for general adult medical examination without abnormal findings: Secondary | ICD-10-CM

## 2020-02-20 MED ORDER — CELECOXIB 200 MG PO CAPS: ORAL_CAPSULE | ORAL | 3 refills | Status: DC

## 2020-02-20 NOTE — Patient Instructions (Signed)
Stop Aleve !!!!!

## 2020-02-20 NOTE — Telephone Encounter (Signed)
Please advise 

## 2020-02-21 ENCOUNTER — Encounter: Payer: Self-pay | Admitting: Family Medicine

## 2020-02-21 LAB — CBC WITH DIFFERENTIAL/PLATELET
Basophils Absolute: 0 10*3/uL (ref 0.0–0.2)
Basos: 1 %
EOS (ABSOLUTE): 0.4 10*3/uL (ref 0.0–0.4)
Eos: 5 %
Hematocrit: 45 % (ref 37.5–51.0)
Hemoglobin: 15 g/dL (ref 13.0–17.7)
Immature Grans (Abs): 0 10*3/uL (ref 0.0–0.1)
Immature Granulocytes: 1 %
Lymphocytes Absolute: 2.6 10*3/uL (ref 0.7–3.1)
Lymphs: 32 %
MCH: 30.4 pg (ref 26.6–33.0)
MCHC: 33.3 g/dL (ref 31.5–35.7)
MCV: 91 fL (ref 79–97)
Monocytes Absolute: 0.8 10*3/uL (ref 0.1–0.9)
Monocytes: 10 %
Neutrophils Absolute: 4.2 10*3/uL (ref 1.4–7.0)
Neutrophils: 51 %
Platelets: 218 10*3/uL (ref 150–450)
RBC: 4.94 x10E6/uL (ref 4.14–5.80)
RDW: 13.8 % (ref 11.6–15.4)
WBC: 8 10*3/uL (ref 3.4–10.8)

## 2020-02-21 LAB — COMPREHENSIVE METABOLIC PANEL
ALT: 14 IU/L (ref 0–44)
AST: 14 IU/L (ref 0–40)
Albumin/Globulin Ratio: 2 (ref 1.2–2.2)
Albumin: 4.5 g/dL (ref 3.8–4.9)
Alkaline Phosphatase: 82 IU/L (ref 39–117)
BUN/Creatinine Ratio: 14 (ref 9–20)
BUN: 13 mg/dL (ref 6–24)
Bilirubin Total: 0.7 mg/dL (ref 0.0–1.2)
CO2: 24 mmol/L (ref 20–29)
Calcium: 9.3 mg/dL (ref 8.7–10.2)
Chloride: 105 mmol/L (ref 96–106)
Creatinine, Ser: 0.95 mg/dL (ref 0.76–1.27)
GFR calc Af Amer: 105 mL/min/{1.73_m2} (ref >59)
GFR calc non Af Amer: 91 mL/min/{1.73_m2} (ref >59)
Globulin, Total: 2.3 g/dL (ref 1.5–4.5)
Glucose: 91 mg/dL (ref 65–99)
Potassium: 4.4 mmol/L (ref 3.5–5.2)
Sodium: 142 mmol/L (ref 134–144)
Total Protein: 6.8 g/dL (ref 6.0–8.5)

## 2020-02-21 LAB — TSH: TSH: 2.93 u[IU]/mL (ref 0.450–4.500)

## 2020-02-21 LAB — LIPID PANEL
Chol/HDL Ratio: 3.9 ratio (ref 0.0–5.0)
Cholesterol, Total: 181 mg/dL (ref 100–199)
HDL: 46 mg/dL (ref >39)
LDL Chol Calc (NIH): 111 mg/dL — ABNORMAL HIGH (ref 0–99)
Triglycerides: 132 mg/dL (ref 0–149)
VLDL Cholesterol Cal: 24 mg/dL (ref 5–40)

## 2020-02-21 LAB — TESTOSTERONE: Testosterone: 195 ng/dL — ABNORMAL LOW (ref 264–916)

## 2020-02-21 LAB — PSA: Prostate Specific Ag, Serum: 1.4 ng/mL (ref 0.0–4.0)

## 2020-02-25 ENCOUNTER — Telehealth: Payer: Self-pay

## 2020-02-25 ENCOUNTER — Telehealth: Payer: Self-pay | Admitting: *Deleted

## 2020-02-25 NOTE — Telephone Encounter (Signed)
-----   Message from Jerrol Banana., MD sent at 02/21/2020  7:46 AM EST ----- Labs all stable.  Testosterone slightly low.  Will consider treating testosterone to see if it helps with energy level.  If patient wishes to pursue this will need an appointment for follow-up to discuss treatment options.

## 2020-02-25 NOTE — Telephone Encounter (Signed)
LMOVM for pt to return call. Okay for PEC to give patient results.

## 2020-02-25 NOTE — Telephone Encounter (Signed)
Called to advise patient on labs, LMTCB.

## 2020-03-01 ENCOUNTER — Other Ambulatory Visit: Payer: Self-pay | Admitting: Family Medicine

## 2020-03-04 NOTE — Telephone Encounter (Signed)
Patient was advised and states that he will wait until his 04/22/2020 appointment to follow-up for testosterone treatment options.

## 2020-04-17 NOTE — Progress Notes (Signed)
Established patient visit   Patient: Maxwell Brown   DOB: 02-13-1966   54 y.o. Male  MRN: [id number] Visit Date: 04/22/2020  Today's healthcare provider: [name], MD   Chief Complaint  Patient presents with  . Follow-up  . Hypertension  . Fatigue   Subjective    HPI Patient would like to discuss hypogonadism from last visit.  Testosterone was less than 50 and has no energy.  Libido is fair.  No mood disturbance. Hypertension, follow-up  BP Readings from Last 3 Encounters:  04/22/20 121/78  02/20/20 121/79  01/30/19 118/76   Wt Readings from Last 3 Encounters:  04/22/20 299 lb (135.6 kg)  02/20/20 293 lb 6.4 oz (133.1 kg)  01/30/19 287 lb (130.2 kg)     He was last seen for hypertension 2 months ago.  BP at that visit was 121/79. Management since that visit includes; Controlled. He reports excellent compliance with treatment. He is not having side effects.  He is not exercising. He is adherent to low salt diet.   Outside blood pressures are .  He does not smoke.  Use of agents associated with hypertension: none.   --------------------------------------------------------------------  Fatigue, unspecified type From 02/20/2020-Discussed etiology. Patient is very inactive now because of his chronic pain. Labs checked showing-   DDD (degenerative disc disease), lumbosacral From 02/20/2020-Severe. Stop OTC Aleve and try Celebrex 200 mg daily and reassess in a month or 2.  Patient states he is doing better since starting Celebrex.       Medications: Outpatient Medications Prior to Visit  Medication Sig  . celecoxib (CELEBREX) 200 MG capsule TAKE 1 CAPSULE BY MOUTH EVERY DAY  . diphenhydrAMINE (ALLERGY RELIEF) 25 MG tablet Take 25 mg by mouth every 6 (six) hours as needed.  . fluticasone (FLONASE) 50 MCG/ACT nasal spray SPRAY 2 SPRAYS INTO EACH NOSTRIL EVERY DAY  . Turmeric Curcumin 500 MG CAPS Take by mouth.  . valACYclovir (VALTREX) 500 MG  tablet TAKE 1 TABLET (500 MG TOTAL) BY MOUTH 2 (TWO) TIMES DAILY.  Marland Kitchen albuterol (PROVENTIL HFA;VENTOLIN HFA) 108 (90 Base) MCG/ACT inhaler Inhale 2 puffs into the lungs every 6 (six) hours as needed for wheezing or shortness of breath. (Patient not taking: Reported on 01/30/2019)  . cyclobenzaprine (FLEXERIL) 10 MG tablet Take 1 tablet (10 mg total) by mouth 3 (three) times daily as needed for muscle spasms. (Patient not taking: Reported on 08/30/2018)  . metaxalone (SKELAXIN) 800 MG tablet Take 1 tablet (800 mg total) by mouth every 4 (four) hours as needed for muscle spasms. (Patient not taking: Reported on 01/30/2019)  . predniSONE (STERAPRED UNI-PAK 48 TAB) 10 MG (48) TBPK tablet Taper as directed. (Patient not taking: Reported on 01/30/2019)  . RaNITidine HCl (ZANTAC 75 PO) Take 1 tablet by mouth 2 (two) times daily. Not sure of the mg dose    Facility-Administered Medications Prior to Visit  Medication Dose Route Frequency Provider  . albuterol (PROVENTIL) (2.5 MG/3ML) 0.083% nebulizer solution 2.5 mg  2.5 mg Nebulization Once Chrismon, Vickki Muff, PA    Review of Systems  Constitutional: Positive for fatigue. Negative for appetite change, chills and fever.  HENT: Negative.   Respiratory: Negative for chest tightness, shortness of breath and wheezing.   Cardiovascular: Negative for chest pain and palpitations.  Gastrointestinal: Negative for abdominal pain, nausea and vomiting.  Musculoskeletal: Positive for back pain.  Allergic/Immunologic: Negative.   Hematological: Negative.   Psychiatric/Behavioral: Negative.  Objective    BP 121/78 (BP Location: Right Arm, Patient Position: Sitting, Cuff Size: Large)   Pulse 88   Temp (!) 97.5 F (36.4 C) (Other (Comment))   Resp 16   Ht 5\' 10"  (1.778 m)   Wt 299 lb (135.6 kg)   SpO2 97%   BMI 42.90 kg/m  BP Readings from Last 3 Encounters:  04/22/20 121/78  02/20/20 121/79  01/30/19 118/76   Wt Readings from Last 3 Encounters:    04/22/20 299 lb (135.6 kg)  02/20/20 293 lb 6.4 oz (133.1 kg)  01/30/19 287 lb (130.2 kg)      Physical Exam Vitals reviewed.  Constitutional:      Appearance: Normal appearance. He is well-developed.  HENT:     Head: Normocephalic and atraumatic.     Right Ear: Tympanic membrane and external ear normal.     Left Ear: Tympanic membrane and external ear normal.     Nose: Nose normal.     Mouth/Throat:     Pharynx: Oropharynx is clear.  Eyes:     General: No scleral icterus.    Conjunctiva/sclera: Conjunctivae normal.  Neck:     Thyroid: No thyromegaly.  Cardiovascular:     Rate and Rhythm: Normal rate and regular rhythm.     Heart sounds: Normal heart sounds.  Pulmonary:     Effort: Pulmonary effort is normal.     Breath sounds: Normal breath sounds.  Abdominal:     General: Abdomen is flat.     Palpations: Abdomen is soft.  Musculoskeletal:     Right lower leg: No edema.     Left lower leg: No edema.  Lymphadenopathy:     Cervical: No cervical adenopathy.  Skin:    General: Skin is warm and dry.  Neurological:     Mental Status: He is alert and oriented to person, place, and time.  Psychiatric:        Mood and Affect: Mood normal.        Behavior: Behavior normal.        Thought Content: Thought content normal.        Judgment: Judgment normal.       No results found for any visits on 04/22/20.  Assessment & Plan     1. Borderline diabetes Follow-up A1c when appropriate.  2. Hypogonadism male Start Clomid 50 mg 1/2 tablet daily.  I will see him back in 2 to 3 months and repeat testosterone level and a prolactin level.   No follow-ups on file.      I, [name], MD, have reviewed all documentation for this visit. The documentation on 04/26/20 for the exam, diagnosis, procedures, and orders are all accurate and complete.    Addelynn Batte Cranford Mon, MD  [facility] [phone] (phone) [phone] (fax)  [place]

## 2020-04-21 ENCOUNTER — Ambulatory Visit: Payer: Self-pay | Admitting: Family Medicine

## 2020-04-22 ENCOUNTER — Ambulatory Visit (INDEPENDENT_AMBULATORY_CARE_PROVIDER_SITE_OTHER): Payer: 59 | Admitting: Family Medicine

## 2020-04-22 ENCOUNTER — Other Ambulatory Visit: Payer: Self-pay

## 2020-04-22 ENCOUNTER — Encounter: Payer: Self-pay | Admitting: Family Medicine

## 2020-04-22 VITALS — BP 121/78 | HR 88 | Temp 97.5°F | Resp 16 | Ht 70.0 in | Wt 299.0 lb

## 2020-04-22 DIAGNOSIS — E291 Testicular hypofunction: Secondary | ICD-10-CM | POA: Diagnosis not present

## 2020-04-22 DIAGNOSIS — R7303 Prediabetes: Secondary | ICD-10-CM | POA: Diagnosis not present

## 2020-04-22 MED ORDER — CLOMIPHENE CITRATE 50 MG PO TABS: 50.0000 mg | ORAL_TABLET | Freq: Every day | ORAL | 3 refills | Status: DC

## 2020-05-06 ENCOUNTER — Encounter: Payer: Self-pay | Admitting: Family Medicine

## 2020-05-20 ENCOUNTER — Encounter: Payer: Self-pay | Admitting: Family Medicine

## 2020-05-21 ENCOUNTER — Telehealth: Payer: Self-pay

## 2020-05-21 NOTE — Telephone Encounter (Signed)
Patient's PA was denied for Clomiphene, please advise sending another medication for him to try.

## 2020-05-27 NOTE — Telephone Encounter (Signed)
I am not sure there is anything any cheaper.  I would have the patient asked which is cheaper, the clomiphene or topical testosterone at the pharmacy

## 2020-05-29 NOTE — Telephone Encounter (Signed)
Advised patient, he said that he will ask his pharmacy which medication is cheaper and then he will let us know.

## 2020-06-04 ENCOUNTER — Other Ambulatory Visit: Payer: Self-pay | Admitting: Family Medicine

## 2020-06-04 NOTE — Telephone Encounter (Signed)
Requested Prescriptions  Pending Prescriptions Disp Refills  . valACYclovir (VALTREX) 500 MG tablet [Pharmacy Med Name: VALACYCLOVIR HCL 500 MG TABLET] 60 tablet 2    Sig: TAKE 1 TABLET (500 MG TOTAL) BY MOUTH 2 (TWO) TIMES DAILY.     Antimicrobials:  Antiviral Agents - Anti-Herpetic Passed - 06/04/2020  1:14 AM      Passed - Valid encounter within last 12 months    Recent Outpatient Visits          1 month ago Borderline diabetes   [facility] Jerrol Banana., MD   3 months ago Annual physical exam   [facility] Jerrol Banana., MD   1 year ago Annual physical exam   [facility] Jerrol Banana., MD   1 year ago Cervical radiculopathy   [facility] Jerrol Banana., MD   1 year ago Chronic thoracic back pain, unspecified back pain laterality   [facility] Jerrol Banana., MD      Future Appointments            In 1 month Jerrol Banana., MD [facility] [place]

## 2020-07-23 ENCOUNTER — Ambulatory Visit: Payer: Self-pay | Admitting: Family Medicine

## 2020-08-28 ENCOUNTER — Other Ambulatory Visit: Payer: Self-pay | Admitting: Family Medicine

## 2020-08-28 NOTE — Telephone Encounter (Signed)
Requested  medications are  due for refill today yes  Requested medications are on the active medication list yes  Last refill 6/23   Future visit scheduled no  Notes to clinic Do not see this med/dx discussed in an office visit, please assess.

## 2020-11-26 ENCOUNTER — Other Ambulatory Visit: Payer: Self-pay | Admitting: Family Medicine

## 2020-11-26 NOTE — Telephone Encounter (Signed)
Requested medications are due for refill today yes  Requested medications are on the active medication list yes  Last refill 9/23  Last visit Do not see this addressed in visit. Dr. Rosanna Randy gave new rx for 3 months in 08/2020, still no scheduled appt.  Future visit scheduled No  Notes to clinic Failed protocol of valid visit within 12 months with no upcoming appt.

## 2020-12-07 ENCOUNTER — Encounter: Payer: Self-pay | Admitting: Family Medicine

## 2021-03-12 ENCOUNTER — Telehealth: Payer: Self-pay

## 2021-03-12 NOTE — Telephone Encounter (Signed)
Copied from [place] [id number]. Topic: General - Other >> Mar 12, 2021 11:03 AM Lennox Solders wrote: Reason for CRM: Pt is calling to speak with carol concerning asking dr Rosanna Randy would he accept a new pt. Pt said he spoke with carol over a wk ago

## 2021-06-01 ENCOUNTER — Other Ambulatory Visit: Payer: Self-pay | Admitting: Family Medicine

## 2021-06-01 NOTE — Telephone Encounter (Signed)
Courtesy refill. Future visit in 2 months

## 2021-08-06 ENCOUNTER — Encounter: Payer: Self-pay | Admitting: Family Medicine

## 2021-08-06 ENCOUNTER — Telehealth: Payer: Self-pay

## 2021-08-06 ENCOUNTER — Other Ambulatory Visit: Payer: Self-pay

## 2021-08-06 ENCOUNTER — Ambulatory Visit (INDEPENDENT_AMBULATORY_CARE_PROVIDER_SITE_OTHER): Payer: Medicare Other | Admitting: Family Medicine

## 2021-08-06 VITALS — BP 121/78 | HR 71 | Temp 97.2°F | Ht 70.5 in | Wt 290.0 lb

## 2021-08-06 DIAGNOSIS — M51379 Other intervertebral disc degeneration, lumbosacral region without mention of lumbar back pain or lower extremity pain: Secondary | ICD-10-CM

## 2021-08-06 DIAGNOSIS — M5137 Other intervertebral disc degeneration, lumbosacral region: Secondary | ICD-10-CM

## 2021-08-06 DIAGNOSIS — N433 Hydrocele, unspecified: Secondary | ICD-10-CM

## 2021-08-06 DIAGNOSIS — Z0000 Encounter for general adult medical examination without abnormal findings: Secondary | ICD-10-CM

## 2021-08-06 DIAGNOSIS — Z125 Encounter for screening for malignant neoplasm of prostate: Secondary | ICD-10-CM

## 2021-08-06 DIAGNOSIS — Z1211 Encounter for screening for malignant neoplasm of colon: Secondary | ICD-10-CM

## 2021-08-06 DIAGNOSIS — Z6841 Body Mass Index (BMI) 40.0 and over, adult: Secondary | ICD-10-CM

## 2021-08-06 DIAGNOSIS — R7303 Prediabetes: Secondary | ICD-10-CM

## 2021-08-06 DIAGNOSIS — Z23 Encounter for immunization: Secondary | ICD-10-CM

## 2021-08-06 DIAGNOSIS — E66813 Obesity, class 3: Secondary | ICD-10-CM

## 2021-08-06 DIAGNOSIS — J309 Allergic rhinitis, unspecified: Secondary | ICD-10-CM

## 2021-08-06 LAB — POCT URINALYSIS DIPSTICK
Bilirubin, UA: NEGATIVE
Blood, UA: NEGATIVE
Glucose, UA: NEGATIVE
Ketones, UA: NEGATIVE
Leukocytes, UA: NEGATIVE
Nitrite, UA: NEGATIVE
Protein, UA: NEGATIVE
Spec Grav, UA: >=1.03 — AB (ref 1.010–1.025)
Urobilinogen, UA: 0.2 E.U./dL
pH, UA: 6 (ref 5.0–8.0)

## 2021-08-06 LAB — IFOBT (OCCULT BLOOD): IFOBT: NEGATIVE

## 2021-08-06 MED ORDER — FLUTICASONE PROPIONATE 50 MCG/ACT NA SUSP
2.0000 | Freq: Every day | NASAL | 6 refills | Status: DC
Start: 2021-08-06 — End: 2022-02-24

## 2021-08-06 NOTE — Progress Notes (Signed)
Complete physical exam   Patient: Maxwell Brown   DOB: [birth year]   55 y.o. Male  MRN: [id number] Visit Date: 08/06/2021  Today's healthcare provider: [name], MD   No chief complaint on file.  Subjective    Maxwell Brown is a 55 y.o. male who presents today for a complete physical exam.  He reports consuming a general diet.  Exercises some.  He generally feels well. He reports sleeping well. He does have additional problems to discuss today.  HPI  Left Testicle pain/swelling recurrent started again in 12/2020.  No dysuria hematuria or other GU symptoms.  It is intermittent.  Past Medical History:  Diagnosis Date   Arthritis    Complication of anesthesia    2005 had a 6-7 hr surgery, he had difficulty whenever he got up, would pass out.  heart tests were normal  "anesthesia" related he was told   Gastritis    last flare up was 3 yrs ago   GERD (gastroesophageal reflux disease)    History of adenomatous polyps of colon 10/07/2016   Past Surgical History:  Procedure Laterality Date   BACK SURGERY     LUMBAR FUSION     L3, L4 2016   NECK SURGERY     UPPER GI ENDOSCOPY     Social History   Socioeconomic History   Marital status: Married    Spouse name: Not on file   Number of children: Not on file   Years of education: Not on file   Highest education level: Not on file  Occupational History   Occupation: Set designer  Tobacco Use   Smoking status: Never   Smokeless tobacco: Never  Vaping Use   Vaping Use: Never used  Substance and Sexual Activity   Alcohol use: No    Alcohol/week: 0.0 standard drinks   Drug use: No   Sexual activity: Not on file  Other Topics Concern   Not on file  Social History Narrative   Not on file   Social Determinants of Health   Financial Resource Strain: Not on file  Food Insecurity: Not on file  Transportation Needs: Not on file  Physical Activity: Not on file  Stress: Not on file  Social Connections: Not on  file  Intimate Partner Violence: Not on file   Family Status  Relation Name Status   Mother  Deceased   Father  Deceased   Brother  Deceased   Brother  Alive   Brother  Alive   Neg Hx  (Not Specified)   Family History  Problem Relation Age of Onset   Diabetes Mother    Heart disease Mother    Transient ischemic attack Mother    Bone cancer Father    Lung cancer Father    Colon cancer Neg Hx    Prostate cancer Neg Hx    Allergies  Allergen Reactions   Other     Pt stated he had a surgery on 10/19/2004, was under deep anaesthesia, did well waking up, but had continuous episodes of passing out when trying to stand.    Betadine [Povidone Iodine] Hives and Rash    Blistered skin     Patient Care Team: Jerrol Banana., MD as PCP - General (Unknown Physician Specialty)   Medications: Outpatient Medications Prior to Visit  Medication Sig   diphenhydrAMINE (BENADRYL) 25 MG tablet Take 25 mg by mouth every 6 (six) hours as needed.   fluticasone (FLONASE)  50 MCG/ACT nasal spray SPRAY 2 SPRAYS INTO EACH NOSTRIL EVERY DAY   metaxalone (SKELAXIN) 800 MG tablet Take 1 tablet (800 mg total) by mouth every 4 (four) hours as needed for muscle spasms.   Turmeric Curcumin 500 MG CAPS Take by mouth.   valACYclovir (VALTREX) 500 MG tablet TAKE 1 TABLET BY MOUTH TWICE A DAY   albuterol (PROVENTIL HFA;VENTOLIN HFA) 108 (90 Base) MCG/ACT inhaler Inhale 2 puffs into the lungs every 6 (six) hours as needed for wheezing or shortness of breath.   celecoxib (CELEBREX) 200 MG capsule TAKE 1 CAPSULE BY MOUTH EVERY DAY   clomiPHENE (CLOMID) 50 MG tablet Take 1 tablet (50 mg total) by mouth daily. 1/2 tablet daily   cyclobenzaprine (FLEXERIL) 10 MG tablet Take 1 tablet (10 mg total) by mouth 3 (three) times daily as needed for muscle spasms.   predniSONE (STERAPRED UNI-PAK 48 TAB) 10 MG (48) TBPK tablet Taper as directed.   RaNITidine HCl (ZANTAC 75 PO) Take 1 tablet by mouth 2 (two) times daily.  Not sure of the mg dose    Facility-Administered Medications Prior to Visit  Medication Dose Route Frequency Provider   albuterol (PROVENTIL) (2.5 MG/3ML) 0.083% nebulizer solution 2.5 mg  2.5 mg Nebulization Once Chrismon, Vickki Muff, PA-C    Review of Systems  Constitutional: Negative.   HENT: Negative.    Eyes: Negative.   Respiratory: Negative.    Cardiovascular: Negative.   Gastrointestinal: Negative.   Endocrine: Negative.   Genitourinary:  Positive for scrotal swelling and testicular pain. Negative for decreased urine volume, difficulty urinating, dysuria, enuresis, flank pain, frequency, genital sores, hematuria, penile discharge, penile pain, penile swelling and urgency.  Musculoskeletal:  Positive for arthralgias, back pain, joint swelling, myalgias, neck pain and neck stiffness. Negative for gait problem.  Skin: Negative.   Allergic/Immunologic: Negative.   Neurological: Negative.   Hematological: Negative.   Psychiatric/Behavioral: Negative.       Objective    BP 121/78 (BP Location: Left Arm, Patient Position: Sitting, Cuff Size: Large)   Pulse 71   Temp (!) 97.2 F (36.2 C) (Oral)   Ht 5' 10.5" (1.791 m)   Wt 290 lb (131.5 kg)   BMI 41.02 kg/m    Physical Exam Vitals reviewed.  Constitutional:      Appearance: He is well-developed. He is obese.  HENT:     Head: Normocephalic and atraumatic.     Right Ear: Tympanic membrane and external ear normal.     Left Ear: Tympanic membrane and external ear normal.     Nose: Nose normal.     Mouth/Throat:     Pharynx: Oropharynx is clear.  Eyes:     General: No scleral icterus.    Conjunctiva/sclera: Conjunctivae normal.  Neck:     Thyroid: No thyromegaly.  Cardiovascular:     Rate and Rhythm: Normal rate and regular rhythm.     Heart sounds: Normal heart sounds.  Pulmonary:     Effort: Pulmonary effort is normal.     Breath sounds: Normal breath sounds.  Abdominal:     General: Abdomen is flat.      Palpations: Abdomen is soft.  Genitourinary:    Penis: Normal.      Testes: Normal.     Prostate: Normal.     Rectum: Normal.     Comments: Some mild fullness above the left testicle consistent with a possible hydrocele Musculoskeletal:     Comments: He has trace bilateral edema of lower extremity  Lymphadenopathy:     Cervical: No cervical adenopathy.  Skin:    General: Skin is warm and dry.  Neurological:     General: No focal deficit present.     Mental Status: He is alert and oriented to person, place, and time.  Psychiatric:        Mood and Affect: Mood normal.        Behavior: Behavior normal.        Thought Content: Thought content normal.        Judgment: Judgment normal.      Last depression screening scores PHQ 2/9 Scores 08/06/2021 02/20/2020 01/30/2019  PHQ - 2 Score 0 0 0  PHQ- 9 Score 1 0 -   Last fall risk screening Fall Risk  08/06/2021  Falls in the past year? 0  Number falls in past yr: 0  Injury with Fall? 0  Risk for fall due to : No Fall Risks  Follow up Falls evaluation completed   Last Audit-C alcohol use screening Alcohol Use Disorder Test (AUDIT) 08/06/2021  1. How often do you have a drink containing alcohol? 0  2. How many drinks containing alcohol do you have on a typical day when you are drinking? 0  3. How often do you have six or more drinks on one occasion? 0  AUDIT-C Score 0  Alcohol Brief Interventions/Follow-up -   A score of 3 or more in women, and 4 or more in men indicates increased risk for alcohol abuse, EXCEPT if all of the points are from question 1   Results for orders placed or performed in visit on 08/06/21  POCT urinalysis dipstick  Result Value Ref Range   Color, UA     Clarity, UA     Glucose, UA Negative Negative   Bilirubin, UA Negative    Ketones, UA Negative    Spec Grav, UA >=1.030 (A) 1.010 - 1.025   Blood, UA Negative    pH, UA 6.0 5.0 - 8.0   Protein, UA Negative Negative   Urobilinogen, UA 0.2 0.2 or 1.0  E.U./dL   Nitrite, UA Negative    Leukocytes, UA Negative Negative   Appearance     Odor      Assessment & Plan    Routine Health Maintenance and Physical Exam  Exercise Activities and Dietary recommendations  Goals   None     Immunization History  Administered Date(s) Administered   Influenza Inj Mdck Quad Pf 09/12/2017   Influenza,inj,Quad PF,6+ Mos 09/13/2016, 08/23/2018   Influenza-Unspecified 09/12/2015   Tdap 01/04/2011    Health Maintenance  Topic Date Due   COVID-19 Vaccine (1) Never done   Pneumococcal Vaccine 59-58 Years old (1 - PCV) Never done   HIV Screening  Never done   Hepatitis C Screening  Never done   Zoster Vaccines- Shingrix (1 of 2) Never done   TETANUS/TDAP  01/04/2021   INFLUENZA VACCINE  07/13/2021   COLONOSCOPY (Pts 45-95yr Insurance coverage will need to be confirmed)  10/04/2021   HPV VACCINES  Aged Out    Discussed health benefits of physical activity, and encouraged him to engage in regular exercise appropriate for his age and condition.  1. Annual physical exam  - POCT urinalysis dipstick - PSA - Lipid panel - Comprehensive metabolic panel - CBC - TSH  2. Screening for prostate cancer  - PSA  3. Need for Tdap vaccination  - Tdap vaccine greater than or equal to 7yo IM  4. Screening  for colon cancer  - IFOBT POC (occult bld, rslt in office)  5. Hydrocele in adult Refer to urology.  I think this is benign - Ambulatory referral to Urology  6. Borderline diabetes   7. DDD (degenerative disc disease), lumbosacral   8. Class 3 severe obesity due to excess calories with serious comorbidity and body mass index (BMI) of 40.0 to 44.9 in adult [facility])    No follow-ups on file.     I, [name], MD, have reviewed all documentation for this visit. The documentation on 08/14/21 for the exam, diagnosis, procedures, and orders are all accurate and complete.    Yerachmiel Spinney Cranford Mon, MD  [facility] [phone] (phone) [phone] (fax)  [place]

## 2021-08-06 NOTE — Telephone Encounter (Signed)
Sent into the pharmacy.  

## 2021-08-06 NOTE — Telephone Encounter (Signed)
Please send in refills of Flonase for Maxwell Brown to [facility] on [address]

## 2021-08-07 LAB — CBC
Hematocrit: 44.6 % (ref 37.5–51.0)
Hemoglobin: 15.2 g/dL (ref 13.0–17.7)
MCH: 30.8 pg (ref 26.6–33.0)
MCHC: 34.1 g/dL (ref 31.5–35.7)
MCV: 91 fL (ref 79–97)
Platelets: 201 10*3/uL (ref 150–450)
RBC: 4.93 x10E6/uL (ref 4.14–5.80)
RDW: 13.4 % (ref 11.6–15.4)
WBC: 6.8 10*3/uL (ref 3.4–10.8)

## 2021-08-07 LAB — COMPREHENSIVE METABOLIC PANEL
ALT: 24 IU/L (ref 0–44)
AST: 23 IU/L (ref 0–40)
Albumin/Globulin Ratio: 2 (ref 1.2–2.2)
Albumin: 4.5 g/dL (ref 3.8–4.9)
Alkaline Phosphatase: 80 IU/L (ref 44–121)
BUN/Creatinine Ratio: 15 (ref 9–20)
BUN: 12 mg/dL (ref 6–24)
Bilirubin Total: 0.8 mg/dL (ref 0.0–1.2)
CO2: 22 mmol/L (ref 20–29)
Calcium: 9.2 mg/dL (ref 8.7–10.2)
Chloride: 105 mmol/L (ref 96–106)
Creatinine, Ser: 0.82 mg/dL (ref 0.76–1.27)
Globulin, Total: 2.3 g/dL (ref 1.5–4.5)
Glucose: 96 mg/dL (ref 65–99)
Potassium: 4.4 mmol/L (ref 3.5–5.2)
Sodium: 142 mmol/L (ref 134–144)
Total Protein: 6.8 g/dL (ref 6.0–8.5)
eGFR: 104 mL/min/{1.73_m2} (ref >59)

## 2021-08-07 LAB — LIPID PANEL
Chol/HDL Ratio: 4.1 ratio (ref 0.0–5.0)
Cholesterol, Total: 158 mg/dL (ref 100–199)
HDL: 39 mg/dL — ABNORMAL LOW (ref >39)
LDL Chol Calc (NIH): 99 mg/dL (ref 0–99)
Triglycerides: 109 mg/dL (ref 0–149)
VLDL Cholesterol Cal: 20 mg/dL (ref 5–40)

## 2021-08-07 LAB — PSA: Prostate Specific Ag, Serum: 2.1 ng/mL (ref 0.0–4.0)

## 2021-08-07 LAB — TSH: TSH: 1.94 u[IU]/mL (ref 0.450–4.500)

## 2021-08-22 ENCOUNTER — Other Ambulatory Visit: Payer: Self-pay | Admitting: Family Medicine

## 2021-08-22 NOTE — Telephone Encounter (Signed)
Requested Prescriptions  Pending Prescriptions Disp Refills  . valACYclovir (VALTREX) 500 MG tablet [Pharmacy Med Name: VALACYCLOVIR HCL 500 MG TABLET] 180 tablet 0    Sig: TAKE 1 TABLET BY MOUTH TWICE A DAY     Antimicrobials:  Antiviral Agents - Anti-Herpetic Passed - 08/22/2021  2:42 PM      Passed - Valid encounter within last 12 months    Recent Outpatient Visits          2 weeks ago Annual physical exam   [facility] Jerrol Banana., MD   1 year ago Borderline diabetes   [facility] Jerrol Banana., MD   1 year ago Annual physical exam   [facility] Jerrol Banana., MD   2 years ago Annual physical exam   [facility] Jerrol Banana., MD   2 years ago Cervical radiculopathy   [facility] Jerrol Banana., MD      Future Appointments            In 2 weeks Stoioff, Ronda Fairly, MD [place]   In 5 months Jerrol Banana., MD [facility] [place]

## 2021-09-02 ENCOUNTER — Ambulatory Visit: Payer: Medicare Other | Admitting: Urology

## 2021-09-03 ENCOUNTER — Ambulatory Visit: Payer: Medicare Other | Admitting: Urology

## 2021-09-10 ENCOUNTER — Encounter: Payer: Self-pay | Admitting: Urology

## 2021-09-10 ENCOUNTER — Ambulatory Visit: Payer: Medicare Other | Admitting: Urology

## 2021-09-10 ENCOUNTER — Other Ambulatory Visit: Payer: Self-pay

## 2021-09-10 VITALS — BP 146/93 | HR 84 | Ht 70.0 in | Wt 290.0 lb

## 2021-09-10 DIAGNOSIS — N5089 Other specified disorders of the male genital organs: Secondary | ICD-10-CM

## 2021-09-10 NOTE — Progress Notes (Signed)
09/10/2021 1:36 PM   DAYQUAN BUYS 1966/10/09 [id number]  Referring provider: Jerrol Banana., MD [address] [place],  [place] [address]  Chief Complaint  Patient presents with   Hydrocele    HPI: Maxwell Brown is a 55 y.o. male referred for evaluation of a hydrocele.  Saw Dr. Rosanna Randy at his annual physical August 2022 and had noted swelling superior to the left testis.  No pain or discomfort No bothersome LUTS PSA 2.1   PMH: Past Medical History:  Diagnosis Date   Arthritis    Complication of anesthesia    2005 had a 6-7 hr surgery, he had difficulty whenever he got up, would pass out.  heart tests were normal  "anesthesia" related he was told   Gastritis    last flare up was 3 yrs ago   GERD (gastroesophageal reflux disease)    History of adenomatous polyps of colon 10/07/2016    Surgical History: Past Surgical History:  Procedure Laterality Date   BACK SURGERY     LUMBAR FUSION     L3, L4 2016   NECK SURGERY     UPPER GI ENDOSCOPY      Home Medications:  Allergies as of 09/10/2021       Reactions   Other    Pt stated he had a surgery on 10/19/2004, was under deep anaesthesia, did well waking up, but had continuous episodes of passing out when trying to stand.    Betadine [povidone Iodine] Hives, Rash   Blistered skin         Medication List        Accurate as of September 10, 2021  1:36 PM. If you have any questions, ask your nurse or doctor.          STOP taking these medications    predniSONE 10 MG (48) Tbpk tablet Commonly known as: STERAPRED UNI-PAK 48 TAB Stopped by: Abbie Sons, MD       TAKE these medications    albuterol 108 (90 Base) MCG/ACT inhaler Commonly known as: VENTOLIN HFA Inhale 2 puffs into the lungs every 6 (six) hours as needed for wheezing or shortness of breath.   celecoxib 200 MG capsule Commonly known as: CELEBREX TAKE 1 CAPSULE BY MOUTH EVERY DAY   clomiPHENE 50 MG  tablet Commonly known as: CLOMID Take 1 tablet (50 mg total) by mouth daily. 1/2 tablet daily   cyclobenzaprine 10 MG tablet Commonly known as: FLEXERIL Take 1 tablet (10 mg total) by mouth 3 (three) times daily as needed for muscle spasms.   diphenhydrAMINE 25 MG tablet Commonly known as: BENADRYL Take 25 mg by mouth every 6 (six) hours as needed.   fluticasone 50 MCG/ACT nasal spray Commonly known as: FLONASE Place 2 sprays into both nostrils daily.   metaxalone 800 MG tablet Commonly known as: SKELAXIN Take 1 tablet (800 mg total) by mouth every 4 (four) hours as needed for muscle spasms.   Turmeric Curcumin 500 MG Caps Take by mouth.   valACYclovir 500 MG tablet Commonly known as: VALTREX TAKE 1 TABLET BY MOUTH TWICE A DAY   ZANTAC 75 PO Take 1 tablet by mouth 2 (two) times daily. Not sure of the mg dose        Allergies:  Allergies  Allergen Reactions   Other     Pt stated he had a surgery on 10/19/2004, was under deep anaesthesia, did well waking up, but had continuous episodes of passing  out when trying to stand.    Betadine [Povidone Iodine] Hives and Rash    Blistered skin     Family History: Family History  Problem Relation Age of Onset   Diabetes Mother    Heart disease Mother    Transient ischemic attack Mother    Bone cancer Father    Lung cancer Father    Colon cancer Neg Hx    Prostate cancer Neg Hx     Social History:  reports that he has never smoked. He has never used smokeless tobacco. He reports that he does not drink alcohol and does not use drugs.   Physical Exam: BP (!) 146/93   Pulse 84   Ht 5\' 10"  (1.778 m)   Wt 290 lb (131.5 kg)   BMI 41.61 kg/m   Constitutional:  Alert and oriented, No acute distress. HEENT: [place] AT, moist mucus membranes.  Trachea midline, no masses. Cardiovascular: No clubbing, cyanosis, or edema. Respiratory: Normal respiratory effort, no increased work of breathing. GI: Abdomen is soft, nontender,  nondistended, no abdominal masses GU: Phallus without lesions.  Testes descended bilaterally without masses or tenderness.  Right spermatic cord normal.  Left spermatic cord with increased fullness/soft mass Skin: No rashes, bruises or suspicious lesions. Neurologic: Grossly intact, no focal deficits, moving all 4 extremities. Psychiatric: Normal mood and affect.   Assessment & Plan:    1.  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

## 2021-09-11 LAB — URINALYSIS, COMPLETE
Bilirubin, UA: NEGATIVE
Glucose, UA: NEGATIVE
Leukocytes,UA: NEGATIVE
Nitrite, UA: NEGATIVE
Protein,UA: NEGATIVE
RBC, UA: NEGATIVE
Specific Gravity, UA: 1.025 (ref 1.005–1.030)
Urobilinogen, Ur: 1 mg/dL (ref 0.2–1.0)
pH, UA: 7 (ref 5.0–7.5)

## 2021-09-11 LAB — MICROSCOPIC EXAMINATION: Bacteria, UA: NONE SEEN

## 2021-11-27 ENCOUNTER — Other Ambulatory Visit: Payer: Self-pay | Admitting: Family Medicine

## 2021-12-31 ENCOUNTER — Encounter: Payer: Self-pay | Admitting: Internal Medicine

## 2022-02-08 ENCOUNTER — Encounter: Payer: Self-pay | Admitting: Family Medicine

## 2022-02-08 ENCOUNTER — Ambulatory Visit (INDEPENDENT_AMBULATORY_CARE_PROVIDER_SITE_OTHER): Payer: Medicare Other | Admitting: Family Medicine

## 2022-02-08 ENCOUNTER — Other Ambulatory Visit: Payer: Self-pay

## 2022-02-08 VITALS — BP 127/88 | HR 82 | Temp 98.2°F | Wt 299.0 lb

## 2022-02-08 DIAGNOSIS — M5030 Other cervical disc degeneration, unspecified cervical region: Secondary | ICD-10-CM

## 2022-02-08 DIAGNOSIS — J309 Allergic rhinitis, unspecified: Secondary | ICD-10-CM | POA: Diagnosis not present

## 2022-02-08 DIAGNOSIS — E291 Testicular hypofunction: Secondary | ICD-10-CM | POA: Diagnosis not present

## 2022-02-08 DIAGNOSIS — R7303 Prediabetes: Secondary | ICD-10-CM | POA: Diagnosis not present

## 2022-02-08 DIAGNOSIS — Z860101 Personal history of adenomatous and serrated colon polyps: Secondary | ICD-10-CM

## 2022-02-08 DIAGNOSIS — N401 Enlarged prostate with lower urinary tract symptoms: Secondary | ICD-10-CM

## 2022-02-08 DIAGNOSIS — Z86010 Personal history of colonic polyps: Secondary | ICD-10-CM | POA: Diagnosis not present

## 2022-02-08 DIAGNOSIS — R350 Frequency of micturition: Secondary | ICD-10-CM | POA: Diagnosis not present

## 2022-02-08 DIAGNOSIS — E66813 Obesity, class 3: Secondary | ICD-10-CM

## 2022-02-08 DIAGNOSIS — Z6841 Body Mass Index (BMI) 40.0 and over, adult: Secondary | ICD-10-CM

## 2022-02-08 NOTE — Progress Notes (Signed)
Argentina Ponder De[place],acting as a scribe for [name], MD.,have documented all relevant documentation on the behalf of [name], MD,as directed by  [name], MD while in the presence of [name], MD.     Established patient visit   Patient: Maxwell Maxwell   DOB: 09/10/1966   56 y.o. Male  MRN: [id number] Visit Date: 02/08/2022  Today's healthcare provider: [name], MD   No chief complaint on file.  Subjective    HPI  Patient comes in today for routine follow-up.  Had [place] in January of last year and since then thinks he has had a occasional blistering rash on the back of both hands that occurs spontaneously.  It resolves within a day or 2.  No pain.  No rash elsewhere. He states he is unable to lose weight because he is not following a diet and is unable to exercise because of his chronic pain.  He wishes to have a 1500-calorie diet printed off. He has a sleep in a recliner due to his back pain.  Naproxen is not helping.  He is disabled after failed back surgeries.  Prediabetes, Follow-up  Lab Results  Component Value Date   HGBA1C 5.5 07/25/2017   HGBA1C 5.7 (H) 10/14/2015   HGBA1C 5.3 10/25/2014   GLUCOSE 96 08/06/2021   GLUCOSE 91 02/20/2020   GLUCOSE 79 01/30/2019    Last seen for for this6 months ago.  Management since that visit includes none. Current symptoms include none .  Prior visit with dietician: no Current diet:  general Current exercise: walking  Pertinent Labs:    Component Value Date/Time   CHOL 158 08/06/2021 1116   TRIG 109 08/06/2021 1116   CHOLHDL 4.1 08/06/2021 1116   CREATININE 0.82 08/06/2021 1116    Wt Readings from Last 3 Encounters:  02/08/22 299 lb (135.6 kg)  09/10/21 290 lb (131.5 kg)  08/06/21 290 lb (131.5 kg)    -----------------------------------------------------------------------------------------   Medications: Outpatient Medications Prior to Visit  Medication Sig    albuterol (PROVENTIL HFA;VENTOLIN HFA) 108 (90 Base) MCG/ACT inhaler Inhale 2 puffs into the lungs every 6 (six) hours as needed for wheezing or shortness of breath.   cyclobenzaprine (FLEXERIL) 10 MG tablet Take 1 tablet (10 mg total) by mouth 3 (three) times daily as needed for muscle spasms.   diphenhydrAMINE (BENADRYL) 25 MG tablet Take 25 mg by mouth every 6 (six) hours as needed.   fluticasone (FLONASE) 50 MCG/ACT nasal spray Place 2 sprays into both nostrils daily.   metaxalone (SKELAXIN) 800 MG tablet Take 1 tablet (800 mg total) by mouth every 4 (four) hours as needed for muscle spasms.   RaNITidine HCl (ZANTAC 75 PO) Take 1 tablet by mouth 2 (two) times daily. Not sure of the mg dose    Turmeric Curcumin 500 MG CAPS Take by mouth.   valACYclovir (VALTREX) 500 MG tablet TAKE 1 TABLET BY MOUTH TWICE A DAY   celecoxib (CELEBREX) 200 MG capsule TAKE 1 CAPSULE BY MOUTH EVERY DAY (Patient not taking: Reported on 09/10/2021)   clomiPHENE (CLOMID) 50 MG tablet Take 1 tablet (50 mg total) by mouth daily. 1/2 tablet daily (Patient not taking: Reported on 09/10/2021)   Facility-Administered Medications Prior to Visit  Medication Dose Route Frequency Provider   albuterol (PROVENTIL) (2.5 MG/3ML) 0.083% nebulizer solution 2.5 mg  2.5 mg Nebulization Once Chrismon, Vickki Muff, PA-C    Review of Systems  Respiratory:  Negative for cough,  shortness of breath and wheezing.   Cardiovascular:  Positive for leg swelling. Negative for chest pain and palpitations.  Gastrointestinal:  Negative for nausea and vomiting.  Endocrine: Negative for polydipsia.  Genitourinary:  Negative for frequency.       Objective    BP 127/88 (BP Location: Right Arm, Patient Position: Sitting, Cuff Size: Large)    Pulse 82    Temp 98.2 F (36.8 C) (Oral)    Wt 299 lb (135.6 kg)    SpO2 99%    BMI 42.90 kg/m     Physical Exam Vitals reviewed.  Constitutional:      Appearance: He is well-developed. He is obese.  HENT:      Head: Normocephalic and atraumatic.     Right Ear: Tympanic membrane and external ear normal.     Left Ear: Tympanic membrane and external ear normal.     Nose: Nose normal.     Mouth/Throat:     Pharynx: Oropharynx is clear.  Eyes:     General: No scleral icterus.    Conjunctiva/sclera: Conjunctivae normal.  Neck:     Thyroid: No thyromegaly.  Cardiovascular:     Rate and Rhythm: Normal rate and regular rhythm.     Heart sounds: Normal heart sounds.  Pulmonary:     Effort: Pulmonary effort is normal.     Breath sounds: Normal breath sounds.  Abdominal:     General: Abdomen is flat.     Palpations: Abdomen is soft.  Musculoskeletal:     Right lower leg: No edema.     Left lower leg: No edema.  Lymphadenopathy:     Cervical: No cervical adenopathy.  Skin:    General: Skin is warm and dry.  Neurological:     Mental Status: He is alert and oriented to person, place, and time.  Psychiatric:        Mood and Affect: Mood normal.        Behavior: Behavior normal.        Thought Content: Thought content normal.        Judgment: Judgment normal.      No results found for any visits on 02/08/22.  Assessment & Plan     1. DDD (degenerative disc disease), cervical Stop naproxen and try meloxicam 15 mg daily as needed for pain.  2. Class 3 severe obesity due to excess calories with serious comorbidity and body mass index (BMI) of 40.0 to 44.9 in adult [facility]) 1500-calorie diet printed out for patient.  3. Hypogonadism male   4. History of adenomatous polyps of colon Colonoscopy next year 2.  5. Allergic rhinitis, unspecified seasonality, unspecified trigger   6. Borderline diabetes Follow-up A1c or fasting sugar  7. Benign prostatic hyperplasia with urinary frequency Flomax sent   No follow-ups on file.      I, [name], MD, have reviewed all documentation for this visit. The documentation on 02/09/22 for the exam, diagnosis, procedures, and orders  are all accurate and complete.    Yash Cacciola Cranford Mon, MD  [facility] [phone] (phone) [phone] (fax)  [place]

## 2022-02-09 MED ORDER — MELOXICAM 15 MG PO TABS: 15.0000 mg | ORAL_TABLET | Freq: Every day | ORAL | 5 refills | Status: DC | PRN

## 2022-02-09 MED ORDER — TAMSULOSIN HCL 0.4 MG PO CAPS: 0.4000 mg | ORAL_CAPSULE | Freq: Every day | ORAL | 11 refills | Status: DC

## 2022-02-10 ENCOUNTER — Encounter: Payer: Self-pay | Admitting: Urology

## 2022-02-22 DIAGNOSIS — H04123 Dry eye syndrome of bilateral lacrimal glands: Secondary | ICD-10-CM | POA: Diagnosis not present

## 2022-02-23 ENCOUNTER — Other Ambulatory Visit: Payer: Self-pay | Admitting: Family Medicine

## 2022-02-24 ENCOUNTER — Other Ambulatory Visit: Payer: Self-pay

## 2022-02-24 DIAGNOSIS — J309 Allergic rhinitis, unspecified: Secondary | ICD-10-CM

## 2022-02-24 MED ORDER — FLUTICASONE PROPIONATE 50 MCG/ACT NA SUSP: 2.0000 | Freq: Every day | NASAL | 11 refills | Status: AC

## 2022-02-24 MED ORDER — MELOXICAM 15 MG PO TABS: 15.0000 mg | ORAL_TABLET | Freq: Every day | ORAL | 5 refills | Status: AC | PRN

## 2022-02-24 NOTE — Telephone Encounter (Signed)
Requested Prescriptions  ?Pending Prescriptions Disp Refills  ?? valACYclovir (VALTREX) 500 MG tablet [Pharmacy Med Name: VALACYCLOVIR HCL 500 MG TABLET] 180 tablet 0  ?  Sig: TAKE 1 TABLET BY MOUTH TWICE A DAY  ?  ? Antimicrobials:  Antiviral Agents - Anti-Herpetic Passed - 02/23/2022  6:30 PM  ?  ?  Passed - Valid encounter within last 12 months  ?  Recent Outpatient Visits   ?      ? 2 weeks ago DDD (degenerative disc disease), cervical  ? [facility] Jerrol Banana., MD  ? 6 months ago Annual physical exam  ? [facility] Jerrol Banana., MD  ? 1 year ago Borderline diabetes  ? [facility] Jerrol Banana., MD  ? 2 years ago Annual physical exam  ? [facility] Jerrol Banana., MD  ? 3 years ago Annual physical exam  ? [facility] Jerrol Banana., MD  ?  ?  ?Future Appointments   ?        ? In 6 months Jerrol Banana., MD [facility]  ?  ? ?  ?  ?  ? ?

## 2022-03-15 ENCOUNTER — Ambulatory Visit: Payer: Self-pay

## 2022-03-15 NOTE — Telephone Encounter (Signed)
?  Chief Complaint: hand pain ?Symptoms: L hand pain and swelling ?Frequency: 1 week or more  ?Pertinent Negatives:  pt states he is unable to make a fist or grip certain things ?Disposition: '[]'$ ED /'[]'$ Urgent Care (no appt availability in office) / '[x]'$ Appointment(In office/virtual)/ '[]'$  [facility]/ '[]'$ Home Care/ '[]'$ Refused Recommended Disposition /'[]'$ [facility]/ '[]'$  Follow-up with PCP ?Additional Notes: Pt has been taking Tylenol for pain and states it hasn't helped any. Advised him to use ice as much as possible until appt 03/16/22.  ? ? ?Reason for Disposition ? [1] SEVERE pain (e.g., excruciating, unable to use hand at all) AND [2] not improved after 2 hours of pain medicine ? ?Answer Assessment - Initial Assessment Questions ?1. ONSET: "When did the pain start?" ?    1 week ago  ?2. LOCATION: "Where is the pain located?" ?    L hand  ?3. PAIN: "How bad is the pain?" (Scale 1-10; or mild, moderate, severe) ?  - MILD (1-3): doesn't interfere with normal activities ?  - MODERATE (4-7): interferes with normal activities (e.g., work or school) or awakens from sleep ?  - SEVERE (8-10): excruciating pain, unable to use hand at all ?    8, Dull ache  ?4. WORK OR EXERCISE: "Has there been any recent work or exercise that involved this part (i.e., hand or wrist) of the body?" ?    No ?6. AGGRAVATING FACTORS: "What makes the pain worse?" (e.g., using computer) ?    Unable to make a fist,  ?7. OTHER SYMPTOMS: "Do you have any other symptoms?" (e.g., neck pain, swelling, rash, numbness, fever) ?    Swelling ? ?Protocols used: Hand and Wrist Pain-A-AH ? ?

## 2022-03-16 ENCOUNTER — Ambulatory Visit
Admission: RE | Admit: 2022-03-16 | Discharge: 2022-03-16 | Disposition: A | Discharge status: Home / Self Care | Payer: BLUE CROSS/BLUE SHIELD | Attending: Physician Assistant | Admitting: Physician Assistant

## 2022-03-16 ENCOUNTER — Ambulatory Visit (INDEPENDENT_AMBULATORY_CARE_PROVIDER_SITE_OTHER): Payer: Medicare Other | Admitting: Physician Assistant

## 2022-03-16 ENCOUNTER — Ambulatory Visit
Admission: RE | Admit: 2022-03-16 | Discharge: 2022-03-16 | Disposition: A | Discharge status: Home / Self Care | Payer: BLUE CROSS/BLUE SHIELD | Source: Ambulatory Visit | Attending: Physician Assistant | Admitting: Physician Assistant

## 2022-03-16 ENCOUNTER — Encounter: Payer: Self-pay | Admitting: Physician Assistant

## 2022-03-16 VITALS — BP 132/92 | HR 100 | Ht 70.0 in | Wt 297.5 lb

## 2022-03-16 DIAGNOSIS — M79642 Pain in left hand: Secondary | ICD-10-CM | POA: Diagnosis not present

## 2022-03-16 DIAGNOSIS — M25532 Pain in left wrist: Secondary | ICD-10-CM

## 2022-03-16 DIAGNOSIS — M7989 Other specified soft tissue disorders: Secondary | ICD-10-CM | POA: Diagnosis not present

## 2022-03-16 NOTE — Progress Notes (Signed)
? ?I,Sha'taria Tyson,acting as a Education administrator for [employer], PA-C.,have documented all relevant documentation on the behalf of Mikey Kirschner, PA-C,as directed by  Mikey Kirschner, PA-C while in the presence of Mikey Kirschner, PA-C. ? ?Established Patient Office Visit ? ?Subjective:  ?Patient ID: Maxwell Brown, male    DOB: [birth year]  Age: 56 y.o. MRN: [id number] ? ?CC: left hand pain x 1 week ? ?Knowledge is a 56 y/o male who presents today with left hand pain at rest and with movement x 1 week. He states in 2017/18 he had a [place] and has had intermittent hand pain since, but significant pain and swelling in the last week. Tenderness to top of hand. Denies bruising, re-injury. Swelling was enough to take off bracelet and ring. ?Managing pain with tylenol and regular mobic use.  ? ?Past Medical History:  ?Diagnosis Date  ? Arthritis   ? Complication of anesthesia   ? 2005 had a 6-7 hr surgery, he had difficulty whenever he got up, would pass out.  heart tests were normal  "anesthesia" related he was told  ? Gastritis   ? last flare up was 3 yrs ago  ? GERD (gastroesophageal reflux disease)   ? History of adenomatous polyps of colon 10/07/2016  ? ? ?Past Surgical History:  ?Procedure Laterality Date  ? BACK SURGERY    ? LUMBAR FUSION    ? L3, L4 2016  ? NECK SURGERY    ? UPPER GI ENDOSCOPY    ? ? ?Family History  ?Problem Relation Age of Onset  ? Diabetes Mother   ? Heart disease Mother   ? Transient ischemic attack Mother   ? Bone cancer Father   ? Lung cancer Father   ? Colon cancer Neg Hx   ? Prostate cancer Neg Hx   ? ? ?Social History  ? ?Socioeconomic History  ? Marital status: Married  ?  Spouse name: Not on file  ? Number of children: Not on file  ? Years of education: Not on file  ? Highest education level: Not on file  ?Occupational History  ? Occupation: Set designer  ?Tobacco Use  ? Smoking status: Never  ? Smokeless tobacco: Never  ?Vaping Use  ? Vaping Use: Never used  ?Substance and Sexual Activity  ? Alcohol  use: No  ?  Alcohol/week: 0.0 standard drinks  ? Drug use: No  ? Sexual activity: Not on file  ?Other Topics Concern  ? Not on file  ?Social History Narrative  ? Not on file  ? ?Social Determinants of Health  ? ?Financial Resource Strain: Not on file  ?Food Insecurity: Not on file  ?Transportation Needs: Not on file  ?Physical Activity: Not on file  ?Stress: Not on file  ?Social Connections: Not on file  ?Intimate Partner Violence: Not on file  ? ? ?Outpatient Medications Prior to Visit  ?Medication Sig Dispense Refill  ? albuterol (PROVENTIL HFA;VENTOLIN HFA) 108 (90 Base) MCG/ACT inhaler Inhale 2 puffs into the lungs every 6 (six) hours as needed for wheezing or shortness of breath. 1 Inhaler 2  ? celecoxib (CELEBREX) 200 MG capsule TAKE 1 CAPSULE BY MOUTH EVERY DAY (Patient not taking: Reported on 09/10/2021) 90 capsule 3  ? clomiPHENE (CLOMID) 50 MG tablet Take 1 tablet (50 mg total) by mouth daily. 1/2 tablet daily (Patient not taking: Reported on 09/10/2021) 30 tablet 3  ? cyclobenzaprine (FLEXERIL) 10 MG tablet Take 1 tablet (10 mg total) by mouth 3 (three) times daily as  needed for muscle spasms. 30 tablet 0  ? diphenhydrAMINE (BENADRYL) 25 MG tablet Take 25 mg by mouth every 6 (six) hours as needed.    ? fluticasone (FLONASE) 50 MCG/ACT nasal spray Place 2 sprays into both nostrils daily. 16 mL 11  ? meloxicam (MOBIC) 15 MG tablet Take 1 tablet (15 mg total) by mouth daily as needed for pain. 30 tablet 5  ? metaxalone (SKELAXIN) 800 MG tablet Take 1 tablet (800 mg total) by mouth every 4 (four) hours as needed for muscle spasms. 120 tablet 2  ? RaNITidine HCl (ZANTAC 75 PO) Take 1 tablet by mouth 2 (two) times daily. Not sure of the mg dose     ? tamsulosin (FLOMAX) 0.4 MG CAPS capsule Take 1 capsule (0.4 mg total) by mouth daily. 30 capsule 11  ? Turmeric Curcumin 500 MG CAPS Take by mouth.    ? valACYclovir (VALTREX) 500 MG tablet TAKE 1 TABLET BY MOUTH TWICE A DAY 180 tablet 0  ? ?Facility-Administered  Medications Prior to Visit  ?Medication Dose Route Frequency Provider Last Rate Last Admin  ? albuterol (PROVENTIL) (2.5 MG/3ML) 0.083% nebulizer solution 2.5 mg  2.5 mg Nebulization Once Chrismon, Vickki Muff, PA-C      ? ? ?Allergies  ?Allergen Reactions  ? Other   ?  Pt stated he had a surgery on 10/19/2004, was under deep anaesthesia, did well waking up, but had continuous episodes of passing out when trying to stand.   ? Betadine [Povidone Iodine] Hives and Rash  ?  Blistered skin   ? ? ?ROS ?Review of Systems  ?Constitutional:  Negative for fatigue and fever.  ?Respiratory:  Negative for cough and shortness of breath.   ?Cardiovascular:  Negative for chest pain, palpitations and leg swelling.  ?Musculoskeletal:  Positive for arthralgias and joint swelling.  ?Neurological:  Negative for dizziness and headaches.  ? ?  ?Objective:  ?  ?Physical Exam ?Constitutional:   ?   Appearance: Normal appearance. He is obese.  ?HENT:  ?   Head: Normocephalic.  ?Eyes:  ?   Conjunctiva/sclera: Conjunctivae normal.  ?Cardiovascular:  ?   Rate and Rhythm: Normal rate.  ?Pulmonary:  ?   Effort: Pulmonary effort is normal. No respiratory distress.  ?Musculoskeletal:  ?   Comments: Left hand with edema, primarily ulnar side and pinky/ring finger. ?No ecchymosis or erythema.  ?Tender with ulnar deviation, pain with gripping/flexion, tender to 4th metacarpal.  ?Neurological:  ?   Mental Status: He is oriented to person, place, and time.  ?Psychiatric:     ?   Mood and Affect: Mood normal.     ?   Behavior: Behavior normal.  ? ? ?There were no vitals taken for this visit. ?Wt Readings from Last 3 Encounters:  ?02/08/22 299 lb (135.6 kg)  ?09/10/21 290 lb (131.5 kg)  ?08/06/21 290 lb (131.5 kg)  ? ? ? ?Health Maintenance Due  ?Topic Date Due  ? COVID-19 Vaccine (1) Never done  ? HIV Screening  Never done  ? Hepatitis C Screening  Never done  ? Zoster Vaccines- Shingrix (1 of 2) Never done  ? ? ?There are no preventive care reminders to  display for this patient. ? ?Lab Results  ?Component Value Date  ? TSH 1.940 08/06/2021  ? ?Lab Results  ?Component Value Date  ? WBC 6.8 08/06/2021  ? HGB 15.2 08/06/2021  ? HCT 44.6 08/06/2021  ? MCV 91 08/06/2021  ? PLT 201 08/06/2021  ? ?Lab  Results  ?Component Value Date  ? NA 142 08/06/2021  ? K 4.4 08/06/2021  ? CO2 22 08/06/2021  ? GLUCOSE 96 08/06/2021  ? BUN 12 08/06/2021  ? CREATININE 0.82 08/06/2021  ? BILITOT 0.8 08/06/2021  ? ALKPHOS 80 08/06/2021  ? AST 23 08/06/2021  ? ALT 24 08/06/2021  ? PROT 6.8 08/06/2021  ? ALBUMIN 4.5 08/06/2021  ? CALCIUM 9.2 08/06/2021  ? ANIONGAP 7 01/22/2015  ? EGFR 104 08/06/2021  ? ?Lab Results  ?Component Value Date  ? CHOL 158 08/06/2021  ? ?Lab Results  ?Component Value Date  ? HDL 39 (L) 08/06/2021  ? ?Lab Results  ?Component Value Date  ? [place] 99 08/06/2021  ? ?Lab Results  ?Component Value Date  ? TRIG 109 08/06/2021  ? ?Lab Results  ?Component Value Date  ? CHOLHDL 4.1 08/06/2021  ? ?Lab Results  ?Component Value Date  ? HGBA1C 5.5 07/25/2017  ? ? ?  ?Assessment & Plan:  ? ?L hand pain ?D/t acute nature and edema, ordered xrays L hand and wrist. ?Can continue with tylenol and mobic, ice recommended. ?Keep bracelet and ring off.  ?Discuss various forms of bracing, but will wait for xray results for further recommendations. ? ?I, Mikey Kirschner, PA-C have reviewed all documentation for this visit. The documentation on  03/16/2022 for the exam, diagnosis, procedures, and orders are all accurate and complete. ? ?Mikey Kirschner, PA-C ?[facility] ?[id number] ?[place], [place], [address] ?Office: [phone] ?Fax: [phone]  ?

## 2022-03-18 ENCOUNTER — Telehealth: Payer: Self-pay | Admitting: Family Medicine

## 2022-03-18 NOTE — Telephone Encounter (Signed)
[name] called in regards to husband's Maxwell Brown Goel) x-ray results. ? ? ?Pt given x ray results per notes of Mikey Kirschner, PA-C on 03/18/22. Pt verbalized understanding.  ?

## 2022-04-26 ENCOUNTER — Ambulatory Visit (INDEPENDENT_AMBULATORY_CARE_PROVIDER_SITE_OTHER): Payer: Medicare Other

## 2022-04-26 VITALS — Wt 297.0 lb

## 2022-04-26 DIAGNOSIS — Z0000 Encounter for general adult medical examination without abnormal findings: Secondary | ICD-10-CM | POA: Diagnosis not present

## 2022-04-26 NOTE — Progress Notes (Signed)
Virtual Visit via Telephone Note  I connected with  Maxwell Brown on 04/26/22 at 11:30 AM EDT by telephone and verified that I am speaking with the correct person using two identifiers.  Location: Patient: home Provider: [facility] Persons participating in the virtual visit: [place]   I discussed the limitations, risks, security and privacy concerns of performing an evaluation and management service by telephone and the availability of in person appointments. The patient expressed understanding and agreed to proceed.  Interactive audio and video telecommunications were attempted between this nurse and patient, however failed, due to patient having technical difficulties OR patient did not have access to video capability.  We continued and completed visit with audio only.  Some vital signs may be absent or patient reported.   Dionisio David, LPN  Subjective:   Maxwell Brown is a 56 y.o. male who presents for an Initial Medicare Annual Wellness Visit.  Review of Systems           Objective:    There were no vitals filed for this visit. There is no height or weight on file to calculate BMI.     10/14/2015    8:19 AM 01/24/2015    8:00 PM 01/22/2015    9:55 AM  Advanced Directives  Does Patient Have a Medical Advance Directive? No No No  Would patient like information on creating a medical advance directive?  No - patient declined information No - patient declined information    Current Medications (verified) Outpatient Encounter Medications as of 04/26/2022  Medication Sig   albuterol (PROVENTIL HFA;VENTOLIN HFA) 108 (90 Base) MCG/ACT inhaler Inhale 2 puffs into the lungs every 6 (six) hours as needed for wheezing or shortness of breath. (Patient not taking: Reported on 03/16/2022)   celecoxib (CELEBREX) 200 MG capsule TAKE 1 CAPSULE BY MOUTH EVERY DAY (Patient not taking: Reported on 09/10/2021)   clomiPHENE (CLOMID) 50 MG tablet Take 1 tablet (50 mg total) by  mouth daily. 1/2 tablet daily (Patient not taking: Reported on 09/10/2021)   cyclobenzaprine (FLEXERIL) 10 MG tablet Take 1 tablet (10 mg total) by mouth 3 (three) times daily as needed for muscle spasms. (Patient not taking: Reported on 03/16/2022)   diphenhydrAMINE (BENADRYL) 25 MG tablet Take 25 mg by mouth every 6 (six) hours as needed.   fluticasone (FLONASE) 50 MCG/ACT nasal spray Place 2 sprays into both nostrils daily.   lansoprazole (PREVACID) 15 MG capsule Take 15 mg by mouth daily at 12 noon.   meloxicam (MOBIC) 15 MG tablet Take 1 tablet (15 mg total) by mouth daily as needed for pain.   metaxalone (SKELAXIN) 800 MG tablet Take 1 tablet (800 mg total) by mouth every 4 (four) hours as needed for muscle spasms.   RaNITidine HCl (ZANTAC 75 PO) Take 1 tablet by mouth 2 (two) times daily. Not sure of the mg dose  (Patient not taking: Reported on 03/16/2022)   tamsulosin (FLOMAX) 0.4 MG CAPS capsule Take 1 capsule (0.4 mg total) by mouth daily. (Patient not taking: Reported on 03/16/2022)   Turmeric Curcumin 500 MG CAPS Take by mouth.   valACYclovir (VALTREX) 500 MG tablet TAKE 1 TABLET BY MOUTH TWICE A DAY   Facility-Administered Encounter Medications as of 04/26/2022  Medication   albuterol (PROVENTIL) (2.5 MG/3ML) 0.083% nebulizer solution 2.5 mg    Allergies (verified) Other and Betadine [povidone iodine]   History: Past Medical History:  Diagnosis Date   Arthritis    Complication of anesthesia  2005 had a 6-7 hr surgery, he had difficulty whenever he got up, would pass out.  heart tests were normal  "anesthesia" related he was told   Gastritis    last flare up was 3 yrs ago   GERD (gastroesophageal reflux disease)    History of adenomatous polyps of colon 10/07/2016   Past Surgical History:  Procedure Laterality Date   BACK SURGERY     LUMBAR FUSION     L3, L4 2016   NECK SURGERY     UPPER GI ENDOSCOPY     Family History  Problem Relation Age of Onset   Diabetes Mother     Heart disease Mother    Transient ischemic attack Mother    Bone cancer Father    Lung cancer Father    Colon cancer Neg Hx    Prostate cancer Neg Hx    Social History   Socioeconomic History   Marital status: Married    Spouse name: Not on file   Number of children: Not on file   Years of education: Not on file   Highest education level: Not on file  Occupational History   Occupation: Set designer  Tobacco Use   Smoking status: Never   Smokeless tobacco: Never  Vaping Use   Vaping Use: Never used  Substance and Sexual Activity   Alcohol use: No    Alcohol/week: 0.0 standard drinks   Drug use: No   Sexual activity: Not on file  Other Topics Concern   Not on file  Social History Narrative   Not on file   Social Determinants of Health   Financial Resource Strain: Not on file  Food Insecurity: Not on file  Transportation Needs: Not on file  Physical Activity: Not on file  Stress: Not on file  Social Connections: Not on file    Tobacco Counseling Counseling given: Not Answered   Clinical Intake:  Pre-visit preparation completed: Yes  Pain : No/denies pain     Nutritional Risks: None Diabetes: No  How often do you need to have someone help you when you read instructions, pamphlets, or other written materials from your doctor or pharmacy?: 1 - Never  Diabetic?no  Interpreter Needed?: No  Information entered by :: Kirke Shaggy, LPN   Activities of Daily Living    03/16/2022    4:14 PM 02/08/2022   10:13 AM  In your present state of health, do you have any difficulty performing the following activities:  Hearing? 0 0  Vision? 0 0  Difficulty concentrating or making decisions? 0 0  Walking or climbing stairs? 0 1  Dressing or bathing? 0 0  Doing errands, shopping? 0 0    Patient Care Team: Jerrol Banana., MD as PCP - General (Unknown Physician Specialty)  Indicate any recent Medical Services you may have received from other than [facility]  providers in the past year (date may be approximate).     Assessment:   This is a routine wellness examination for [place].  Hearing/Vision screen No results found.  Dietary issues and exercise activities discussed:     Goals Addressed   None    Depression Screen    03/16/2022    4:13 PM 02/08/2022   10:13 AM 08/06/2021   10:29 AM 02/20/2020    1:43 PM 01/30/2019    2:21 PM 08/30/2018   11:02 AM 07/25/2017    8:58 AM  PHQ 2/9 Scores  PHQ - 2 Score 0 0 0 0 0  0 0  PHQ- 9 Score 0 5 1 0   4    Fall Risk    03/16/2022    4:13 PM 02/08/2022   10:13 AM 08/06/2021   10:29 AM 02/20/2020    1:43 PM  Fall Risk   Falls in the past year? 0 0 0 0  Number falls in past yr: 0  0 0  Injury with Fall? 0  0 0  Risk for fall due to : No Fall Risks  No Fall Risks   Follow up   Falls evaluation completed Falls evaluation completed    [place]:  Any stairs in or around the home? Yes  If so, are there any without handrails? No  Home free of loose throw rugs in walkways, pet beds, electrical cords, etc? Yes  Adequate lighting in your home to reduce risk of falls? Yes   ASSISTIVE DEVICES UTILIZED TO PREVENT FALLS:  Life alert? No  Use of a cane, walker or w/c? No  Grab bars in the bathroom? Yes  Shower chair or bench in shower? Yes  Elevated toilet seat or a handicapped toilet? No   Cognitive Function: 0 pts 6CIT        Immunizations Immunization History  Administered Date(s) Administered   Influenza Inj Mdck Quad Pf 09/12/2017, 09/07/2021   Influenza,inj,Quad PF,6+ Mos 09/13/2016, 08/23/2018, 09/27/2020   Influenza-Unspecified 09/12/2015   Tdap 01/04/2011, 08/06/2021    TDAP status: Up to date  Flu Vaccine status: Up to date  Pneumococcal vaccine status: Declined,  Education has been provided regarding the importance of this vaccine but patient still declined. Advised may receive this vaccine at local pharmacy or Health Dept. Aware to provide a  copy of the vaccination record if obtained from local pharmacy or Health Dept. Verbalized acceptance and understanding.   Covid-19 vaccine status: Declined, Education has been provided regarding the importance of this vaccine but patient still declined. Advised may receive this vaccine at local pharmacy or Health Dept.or vaccine clinic. Aware to provide a copy of the vaccination record if obtained from local pharmacy or Health Dept. Verbalized acceptance and understanding.  Qualifies for Shingles Vaccine? No   Zostavax completed No   Shingrix Completed?: No.    Education has been provided regarding the importance of this vaccine. Patient has been advised to call insurance company to determine out of pocket expense if they have not yet received this vaccine. Advised may also receive vaccine at local pharmacy or Health Dept. Verbalized acceptance and understanding.  Screening Tests Health Maintenance  Topic Date Due   COVID-19 Vaccine (1) Never done   HIV Screening  Never done   Hepatitis C Screening  Never done   Zoster Vaccines- Shingrix (1 of 2) Never done   INFLUENZA VACCINE  07/13/2022   COLONOSCOPY (Pts 45-33yr Insurance coverage will need to be confirmed)  10/05/2023   TETANUS/TDAP  08/07/2031   HPV VACCINES  Aged Out    Health Maintenance  Health Maintenance Due  Topic Date Due   COVID-19 Vaccine (1) Never done   HIV Screening  Never done   Hepatitis C Screening  Never done   Zoster Vaccines- Shingrix (1 of 2) Never done    Colorectal cancer screening: Type of screening: Colonoscopy. Completed 10/04/16. Repeat every 7 years  Lung Cancer Screening: (Low Dose CT Chest recommended if Age 56-80years, 30 pack-year currently smoking OR have quit w/in 15years.) does not qualify.    Additional Screening:  Hepatitis C Screening: does qualify; Completed no  Vision Screening: Recommended annual ophthalmology exams for early detection of glaucoma and other disorders of the  eye. Is the patient up to date with their annual eye exam?  Yes  Who is the provider or what is the name of the office in which the patient attends annual eye exams? [facility] If pt is not established with a provider, would they like to be referred to a provider to establish care? No .   Dental Screening: Recommended annual dental exams for proper oral hygiene  Community Resource Referral / Chronic Care Management: CRR required this visit?  No   CCM required this visit?  No      Plan:     I have personally reviewed and noted the following in the patient's chart:   Medical and social history Use of alcohol, tobacco or illicit drugs  Current medications and supplements including opioid prescriptions. Patient is not currently taking opioid prescriptions. Functional ability and status Nutritional status Physical activity Advanced directives List of other physicians Hospitalizations, surgeries, and ER visits in previous 12 months Vitals Screenings to include cognitive, depression, and falls Referrals and appointments  In addition, I have reviewed and discussed with patient certain preventive protocols, quality metrics, and best practice recommendations. A written personalized care plan for preventive services as well as general preventive health recommendations were provided to patient.     Dionisio David, LPN   6/81/2751   Nurse Notes: none

## 2022-04-26 NOTE — Patient Instructions (Signed)
[name] , ?Thank you for taking time to come for your Medicare Wellness Visit. I appreciate your ongoing commitment to your health goals. Please review the following plan we discussed and let me know if I can assist you in the future.  ? ?Screening recommendations/referrals: ?Colonoscopy: 10/04/16 ?Recommended yearly ophthalmology/optometry visit for glaucoma screening and checkup ?Recommended yearly dental visit for hygiene and checkup ? ?Vaccinations: ?Influenza vaccine: 09/07/21 ?Pneumococcal vaccine: n/d ?Tdap vaccine: 08/06/21 ?Shingles vaccine: n/d   ?Covid-19: n/d ? ?Advanced directives: no ? ?Conditions/risks identified: none ? ?Next appointment: Follow up in one year for your annual wellness visit. 04/28/23 @ 10:30am by phone ? ?Preventive Care 56 Years and Older, Male ?Preventive care refers to lifestyle choices and visits with your health care provider that can promote health and wellness. ?What does preventive care include? ?A yearly physical exam. This is also called an annual well check. ?Dental exams once or twice a year. ?Routine eye exams. Ask your health care provider how often you should have your eyes checked. ?Personal lifestyle choices, including: ?Daily care of your teeth and gums. ?Regular physical activity. ?Eating a healthy diet. ?Avoiding tobacco and drug use. ?Limiting alcohol use. ?Practicing safe sex. ?Taking low doses of aspirin every day. ?Taking vitamin and mineral supplements as recommended by your health care provider. ?What happens during an annual well check? ?The services and screenings done by your health care provider during your annual well check will depend on your age, overall health, lifestyle risk factors, and family history of disease. ?Counseling  ?Your health care provider may ask you questions about your: ?Alcohol use. ?Tobacco use. ?Drug use. ?Emotional well-being. ?Home and relationship well-being. ?Sexual activity. ?Eating habits. ?History of falls. ?Memory and  ability to understand (cognition). ?Work and work Statistician. ?Screening  ?You may have the following tests or measurements: ?Height, weight, and BMI. ?Blood pressure. ?Lipid and cholesterol levels. These may be checked every 5 years, or more frequently if you are over 85 years old. ?Skin check. ?Lung cancer screening. You may have this screening every year starting at age 12 if you have a 30-pack-year history of smoking and currently smoke or have quit within the past 15 years. ?Fecal occult blood test (FOBT) of the stool. You may have this test every year starting at age 85. ?Flexible sigmoidoscopy or colonoscopy. You may have a sigmoidoscopy every 5 years or a colonoscopy every 10 years starting at age 49. ?Prostate cancer screening. Recommendations will vary depending on your family history and other risks. ?Hepatitis C blood test. ?Hepatitis B blood test. ?Sexually transmitted disease (STD) testing. ?Diabetes screening. This is done by checking your blood sugar (glucose) after you have not eaten for a while (fasting). You may have this done every 1-3 years. ?Abdominal aortic aneurysm (AAA) screening. You may need this if you are a current or former smoker. ?Osteoporosis. You may be screened starting at age 87 if you are at high risk. ?Talk with your health care provider about your test results, treatment options, and if necessary, the need for more tests. ?Vaccines  ?Your health care provider may recommend certain vaccines, such as: ?Influenza vaccine. This is recommended every year. ?Tetanus, diphtheria, and acellular pertussis (Tdap, Td) vaccine. You may need a Td booster every 10 years. ?Zoster vaccine. You may need this after age 46. ?Pneumococcal 13-valent conjugate (PCV13) vaccine. One dose is recommended after age 81. ?Pneumococcal polysaccharide (PPSV23) vaccine. One dose is recommended after age 7. ?Talk to your health care provider about which  screenings and vaccines you need and how often you need  them. ?This information is not intended to replace advice given to you by your health care provider. Make sure you discuss any questions you have with your health care provider. ?Document Released: 12/26/2015 Document Revised: 08/18/2016 Document Reviewed: 09/30/2015 ?Elsevier Interactive Patient Education ? 2017 [employer]. ? ?Fall Prevention in the Home ?Falls can cause injuries. They can happen to people of all ages. There are many things you can do to make your home safe and to help prevent falls. ?What can I do on the outside of my home? ?Regularly fix the edges of walkways and driveways and fix any cracks. ?Remove anything that might make you trip as you walk through a door, such as a raised step or threshold. ?Trim any bushes or trees on the path to your home. ?Use bright outdoor lighting. ?Clear any walking paths of anything that might make someone trip, such as rocks or tools. ?Regularly check to see if handrails are loose or broken. Make sure that both sides of any steps have handrails. ?Any raised decks and porches should have guardrails on the edges. ?Have any leaves, snow, or ice cleared regularly. ?Use sand or salt on walking paths during winter. ?Clean up any spills in your garage right away. This includes oil or grease spills. ?What can I do in the bathroom? ?Use night lights. ?Install grab bars by the toilet and in the tub and shower. Do not use towel bars as grab bars. ?Use non-skid mats or decals in the tub or shower. ?If you need to sit down in the shower, use a plastic, non-slip stool. ?Keep the floor dry. Clean up any water that spills on the floor as soon as it happens. ?Remove soap buildup in the tub or shower regularly. ?Attach bath mats securely with double-sided non-slip rug tape. ?Do not have throw rugs and other things on the floor that can make you trip. ?What can I do in the bedroom? ?Use night lights. ?Make sure that you have a light by your bed that is easy to reach. ?Do not use  any sheets or blankets that are too big for your bed. They should not hang down onto the floor. ?Have a firm chair that has side arms. You can use this for support while you get dressed. ?Do not have throw rugs and other things on the floor that can make you trip. ?What can I do in the kitchen? ?Clean up any spills right away. ?Avoid walking on wet floors. ?Keep items that you use a lot in easy-to-reach places. ?If you need to reach something above you, use a strong step stool that has a grab bar. ?Keep electrical cords out of the way. ?Do not use floor polish or wax that makes floors slippery. If you must use wax, use non-skid floor wax. ?Do not have throw rugs and other things on the floor that can make you trip. ?What can I do with my stairs? ?Do not leave any items on the stairs. ?Make sure that there are handrails on both sides of the stairs and use them. Fix handrails that are broken or loose. Make sure that handrails are as long as the stairways. ?Check any carpeting to make sure that it is firmly attached to the stairs. Fix any carpet that is loose or worn. ?Avoid having throw rugs at the top or bottom of the stairs. If you do have throw rugs, attach them to the floor with carpet  tape. ?Make sure that you have a light switch at the top of the stairs and the bottom of the stairs. If you do not have them, ask someone to add them for you. ?What else can I do to help prevent falls? ?Wear shoes that: ?Do not have high heels. ?Have rubber bottoms. ?Are comfortable and fit you well. ?Are closed at the toe. Do not wear sandals. ?If you use a stepladder: ?Make sure that it is fully opened. Do not climb a closed stepladder. ?Make sure that both sides of the stepladder are locked into place. ?Ask someone to hold it for you, if possible. ?Clearly mark and make sure that you can see: ?Any grab bars or handrails. ?First and last steps. ?Where the edge of each step is. ?Use tools that help you move around (mobility aids)  if they are needed. These include: ?Canes. ?Walkers. ?Scooters. ?Crutches. ?Turn on the lights when you go into a dark area. Replace any light bulbs as soon as they burn out. ?Set up your furniture so you

## 2022-04-27 ENCOUNTER — Ambulatory Visit
Admission: RE | Admit: 2022-04-27 | Discharge: 2022-04-27 | Disposition: A | Discharge status: Home / Self Care | Payer: Medicare Other | Source: Ambulatory Visit | Attending: Family Medicine | Admitting: Family Medicine

## 2022-04-27 ENCOUNTER — Ambulatory Visit (INDEPENDENT_AMBULATORY_CARE_PROVIDER_SITE_OTHER): Payer: Medicare Other | Admitting: Family Medicine

## 2022-04-27 ENCOUNTER — Encounter: Payer: Self-pay | Admitting: Family Medicine

## 2022-04-27 ENCOUNTER — Ambulatory Visit
Admission: RE | Admit: 2022-04-27 | Discharge: 2022-04-27 | Disposition: A | Discharge status: Home / Self Care | Payer: Medicare Other | Attending: Family Medicine | Admitting: Family Medicine

## 2022-04-27 VITALS — BP 132/90 | HR 87 | Resp 16 | Wt 284.0 lb

## 2022-04-27 DIAGNOSIS — R109 Unspecified abdominal pain: Secondary | ICD-10-CM

## 2022-04-27 DIAGNOSIS — Z87442 Personal history of urinary calculi: Secondary | ICD-10-CM

## 2022-04-27 DIAGNOSIS — R3915 Urgency of urination: Secondary | ICD-10-CM | POA: Diagnosis not present

## 2022-04-27 DIAGNOSIS — I878 Other specified disorders of veins: Secondary | ICD-10-CM | POA: Diagnosis not present

## 2022-04-27 LAB — POCT URINALYSIS DIPSTICK
Bilirubin, UA: NEGATIVE
Blood, UA: NEGATIVE
Glucose, UA: NEGATIVE
Ketones, UA: NEGATIVE
Leukocytes, UA: NEGATIVE
Nitrite, UA: NEGATIVE
Protein, UA: NEGATIVE
Spec Grav, UA: 1.025 (ref 1.010–1.025)
Urobilinogen, UA: 0.2 E.U./dL
pH, UA: 5 (ref 5.0–8.0)

## 2022-04-27 MED ORDER — SULFAMETHOXAZOLE-TRIMETHOPRIM 800-160 MG PO TABS: 1.0000 | ORAL_TABLET | Freq: Two times a day (BID) | ORAL | 0 refills | Status: DC

## 2022-04-27 NOTE — Progress Notes (Signed)
Established patient visit  I,[name],acting as a scribe for [name], MD.,have documented all relevant documentation on the behalf of [name], MD,as directed by  [name], MD while in the presence of [name], MD.   Patient: Maxwell Brown   DOB: Dec 24, 1965   56 y.o. Male  MRN: [id number] Visit Date: 04/27/2022  Today's healthcare provider: [name], MD   Chief Complaint  Patient presents with   Flank Pain   Subjective    HPI  Patient believes he has a kidney stone. Right flank pain, fever, urine incontinence. Patient states he has had an episode 3 weeks ago with the same symptoms and again this past weekend. Patient also has symptoms of urgency. Patient did see blood in his urine 3 weeks ago but not this weekend. Patient has had stone.   And this feels like the kidney stone that he had before Medications: Outpatient Medications Prior to Visit  Medication Sig   diphenhydrAMINE (BENADRYL) 25 MG tablet Take 25 mg by mouth every 6 (six) hours as needed.   fluticasone (FLONASE) 50 MCG/ACT nasal spray Place 2 sprays into both nostrils daily.   lansoprazole (PREVACID) 15 MG capsule Take 15 mg by mouth daily at 12 noon.   meloxicam (MOBIC) 15 MG tablet Take 1 tablet (15 mg total) by mouth daily as needed for pain.   metaxalone (SKELAXIN) 800 MG tablet Take 1 tablet (800 mg total) by mouth every 4 (four) hours as needed for muscle spasms.   Turmeric Curcumin 500 MG CAPS Take by mouth.   valACYclovir (VALTREX) 500 MG tablet TAKE 1 TABLET BY MOUTH TWICE A DAY   [DISCONTINUED] albuterol (PROVENTIL HFA;VENTOLIN HFA) 108 (90 Base) MCG/ACT inhaler Inhale 2 puffs into the lungs every 6 (six) hours as needed for wheezing or shortness of breath. (Patient not taking: Reported on 03/16/2022)   [DISCONTINUED] celecoxib (CELEBREX) 200 MG capsule TAKE 1 CAPSULE BY MOUTH EVERY DAY (Patient not taking: Reported on 09/10/2021)   [DISCONTINUED]  clomiPHENE (CLOMID) 50 MG tablet Take 1 tablet (50 mg total) by mouth daily. 1/2 tablet daily (Patient not taking: Reported on 09/10/2021)   [DISCONTINUED] cyclobenzaprine (FLEXERIL) 10 MG tablet Take 1 tablet (10 mg total) by mouth 3 (three) times daily as needed for muscle spasms. (Patient not taking: Reported on 03/16/2022)   [DISCONTINUED] RaNITidine HCl (ZANTAC 75 PO) Take 1 tablet by mouth 2 (two) times daily. Not sure of the mg dose  (Patient not taking: Reported on 03/16/2022)   [DISCONTINUED] tamsulosin (FLOMAX) 0.4 MG CAPS capsule Take 1 capsule (0.4 mg total) by mouth daily. (Patient not taking: Reported on 03/16/2022)   [DISCONTINUED] albuterol (PROVENTIL) (2.5 MG/3ML) 0.083% nebulizer solution 2.5 mg    No facility-administered medications prior to visit.    Review of Systems  Constitutional:  Negative for appetite change, chills and fever.  Respiratory:  Negative for chest tightness, shortness of breath and wheezing.   Cardiovascular:  Negative for chest pain and palpitations.  Gastrointestinal:  Negative for abdominal pain, nausea and vomiting.  Genitourinary:  Positive for difficulty urinating, frequency, hematuria and urgency.   Last CBC Lab Results  Component Value Date   WBC 6.8 08/06/2021   HGB 15.2 08/06/2021   HCT 44.6 08/06/2021   MCV 91 08/06/2021   MCH 30.8 08/06/2021   RDW 13.4 08/06/2021   PLT 201 08/06/2021       Objective    BP 132/90 (BP Location: Left  Arm, Patient Position: Sitting, Cuff Size: Large)   Pulse 87   Resp 16   Wt 284 lb (128.8 kg)   SpO2 97%   BMI 40.75 kg/m  BP Readings from Last 3 Encounters:  04/27/22 132/90  03/16/22 (!) 132/92  02/08/22 127/88   Wt Readings from Last 3 Encounters:  04/27/22 284 lb (128.8 kg)  04/26/22 297 lb (134.7 kg)  03/16/22 297 lb 8 oz (134.9 kg)      Physical Exam Vitals reviewed.  Constitutional:      Appearance: He is well-developed. He is obese.  HENT:     Head: Normocephalic and atraumatic.      Right Ear: Tympanic membrane and external ear normal.     Left Ear: Tympanic membrane and external ear normal.     Nose: Nose normal.     Mouth/Throat:     Pharynx: Oropharynx is clear.  Eyes:     General: No scleral icterus.    Conjunctiva/sclera: Conjunctivae normal.  Neck:     Thyroid: No thyromegaly.  Cardiovascular:     Rate and Rhythm: Normal rate and regular rhythm.     Heart sounds: Normal heart sounds.  Pulmonary:     Effort: Pulmonary effort is normal.     Breath sounds: Normal breath sounds.  Abdominal:     Palpations: Abdomen is soft.     Tenderness: There is no abdominal tenderness.  Musculoskeletal:     Right lower leg: No edema.     Left lower leg: No edema.  Lymphadenopathy:     Cervical: No cervical adenopathy.  Skin:    General: Skin is warm and dry.  Neurological:     Mental Status: He is alert and oriented to person, place, and time.  Psychiatric:        Mood and Affect: Mood normal.        Behavior: Behavior normal.        Thought Content: Thought content normal.        Judgment: Judgment normal.      No results found for any visits on 04/27/22.  Assessment & Plan     1. Acute right flank pain ABD and treat with Septra DS for 5 days and give Norco for pain.  I think this is stone sigmoid and refer to urology as he has had them before.  He has never seen urology for the stones - Ambulatory referral to Urology - DG Abd 1 View - CULTURE, URINE COMPREHENSIVE - sulfamethoxazole-trimethoprim (BACTRIM DS) 800-160 MG tablet; Take 1 tablet by mouth 2 (two) times daily.  Dispense: 10 tablet; Refill: 0 - HYDROcodone-acetaminophen (NORCO) 10-325 MG tablet; Take 1 tablet by mouth every 4 (four) hours as needed.  Dispense: 30 tablet; Refill: 0  2. Urgency of urination  - POCT urinalysis dipstick - Ambulatory referral to Urology - CULTURE, URINE COMPREHENSIVE - sulfamethoxazole-trimethoprim (BACTRIM DS) 800-160 MG tablet; Take 1 tablet by mouth 2 (two) times  daily.  Dispense: 10 tablet; Refill: 0  3. History of kidney stones  - Ambulatory referral to Urology   No follow-ups on file.      I, [name], MD, have reviewed all documentation for this visit. The documentation on 04/30/22 for the exam, diagnosis, procedures, and orders are all accurate and complete.    Laquandra Carrillo Cranford Mon, MD  [facility] [phone] (phone) [phone] (fax)  [place]

## 2022-04-30 LAB — CULTURE, URINE COMPREHENSIVE

## 2022-04-30 MED ORDER — HYDROCODONE-ACETAMINOPHEN 10-325 MG PO TABS: 1.0000 | ORAL_TABLET | ORAL | 0 refills | Status: AC | PRN

## 2022-05-07 ENCOUNTER — Ambulatory Visit: Payer: Medicare Other | Admitting: Urology

## 2022-05-22 ENCOUNTER — Other Ambulatory Visit: Payer: Self-pay | Admitting: Family Medicine

## 2022-07-01 ENCOUNTER — Encounter: Payer: Self-pay | Admitting: Family Medicine

## 2022-07-01 ENCOUNTER — Ambulatory Visit (INDEPENDENT_AMBULATORY_CARE_PROVIDER_SITE_OTHER): Payer: Medicare Other | Admitting: Family Medicine

## 2022-07-01 VITALS — BP 110/74 | HR 85 | Temp 98.6°F | Resp 16

## 2022-07-01 DIAGNOSIS — J069 Acute upper respiratory infection, unspecified: Secondary | ICD-10-CM

## 2022-07-01 DIAGNOSIS — Z6841 Body Mass Index (BMI) 40.0 and over, adult: Secondary | ICD-10-CM | POA: Diagnosis not present

## 2022-07-01 DIAGNOSIS — J309 Allergic rhinitis, unspecified: Secondary | ICD-10-CM | POA: Diagnosis not present

## 2022-07-01 MED ORDER — PREDNISONE 20 MG PO TABS: 20.0000 mg | ORAL_TABLET | Freq: Every day | ORAL | 1 refills | Status: DC

## 2022-07-01 NOTE — Progress Notes (Signed)
Established patient visit   Patient: Maxwell Brown   DOB: 07-16-1966   56 y.o. Male  MRN: [id number] Visit Date: 07/01/2022  Today's healthcare provider: [name], MD   Chief Complaint  Patient presents with   Nasal Congestion   Subjective    HPI  Patient has had nasal and chest congestion for 2 weeks. Patient states congestion started in his sinuses and has now moved in to his chest. Cough is productive. Also has symptoms of wheezing and shortness of breath. Patient has been taking vitamin C and Zinc, also takes a daily allergy medication.  Medications: Outpatient Medications Prior to Visit  Medication Sig   diphenhydrAMINE (BENADRYL) 25 MG tablet Take 25 mg by mouth every 6 (six) hours as needed.   fluticasone (FLONASE) 50 MCG/ACT nasal spray Place 2 sprays into both nostrils daily.   HYDROcodone-acetaminophen (NORCO) 10-325 MG tablet Take 1 tablet by mouth every 4 (four) hours as needed.   lansoprazole (PREVACID) 15 MG capsule Take 15 mg by mouth daily at 12 noon.   meloxicam (MOBIC) 15 MG tablet Take 1 tablet (15 mg total) by mouth daily as needed for pain.   metaxalone (SKELAXIN) 800 MG tablet Take 1 tablet (800 mg total) by mouth every 4 (four) hours as needed for muscle spasms.   sulfamethoxazole-trimethoprim (BACTRIM DS) 800-160 MG tablet Take 1 tablet by mouth 2 (two) times daily.   Turmeric Curcumin 500 MG CAPS Take by mouth.   valACYclovir (VALTREX) 500 MG tablet TAKE 1 TABLET BY MOUTH TWICE A DAY   No facility-administered medications prior to visit.    Review of Systems  Constitutional:  Negative for appetite change, chills and fever.  HENT:  Positive for congestion.   Respiratory:  Positive for cough. Negative for chest tightness, shortness of breath and wheezing.   Cardiovascular:  Negative for chest pain and palpitations.  Gastrointestinal:  Negative for abdominal pain, nausea and vomiting.    Last hemoglobin A1c Lab Results  Component  Value Date   HGBA1C 5.5 07/25/2017       Objective    BP 110/74 (BP Location: Right Arm, Patient Position: Sitting, Cuff Size: Large)   Pulse 85   Temp 98.6 F (37 C) (Oral)   Resp 16   SpO2 96%  BP Readings from Last 3 Encounters:  07/01/22 110/74  04/27/22 132/90  03/16/22 (!) 132/92   Wt Readings from Last 3 Encounters:  04/27/22 284 lb (128.8 kg)  04/26/22 297 lb (134.7 kg)  03/16/22 297 lb 8 oz (134.9 kg)      Physical Exam Vitals reviewed.  Constitutional:      Appearance: He is well-developed. He is obese.  HENT:     Head: Normocephalic and atraumatic.     Right Ear: Tympanic membrane and external ear normal.     Left Ear: Tympanic membrane and external ear normal.     Nose: Nose normal.     Mouth/Throat:     Pharynx: Oropharynx is clear.  Eyes:     General: No scleral icterus.    Conjunctiva/sclera: Conjunctivae normal.  Neck:     Thyroid: No thyromegaly.  Cardiovascular:     Rate and Rhythm: Normal rate and regular rhythm.     Heart sounds: Normal heart sounds.  Pulmonary:     Effort: Pulmonary effort is normal.     Breath sounds: Normal breath sounds.  Abdominal:     General: Abdomen is flat.  Palpations: Abdomen is soft.  Musculoskeletal:     Right lower leg: No edema.     Left lower leg: No edema.  Lymphadenopathy:     Cervical: No cervical adenopathy.  Skin:    General: Skin is warm and dry.  Neurological:     Mental Status: He is alert and oriented to person, place, and time.  Psychiatric:        Mood and Affect: Mood normal.        Behavior: Behavior normal.        Thought Content: Thought content normal.        Judgment: Judgment normal.     No results found for any visits on 07/01/22.  Assessment & Plan     1. Viral upper respiratory tract infection Robitussin twice a day and prednisone 20 mg daily for 5 days.  2. Allergic rhinitis, unspecified seasonality, unspecified trigger As above.  3. Class 3 severe obesity due to  excess calories with serious comorbidity and body mass index (BMI) of 40.0 to 44.9 in adult [facility]) Diet and extra   No follow-ups on file.      I, Maxwell Durie, MD, have reviewed all documentation for this visit. The documentation on 07/05/22 for the exam, diagnosis, procedures, and orders are all accurate and complete.    Maxwell Brown Mon, MD  [facility] [phone] (phone) [phone] (fax)  [place]

## 2022-08-20 NOTE — Progress Notes (Unsigned)
Complete physical exam  I,[name],acting as a scribe for Maxwell Durie, MD.,have documented all relevant documentation on the behalf of Maxwell Durie, MD,as directed by  Maxwell Durie, MD while in the presence of Maxwell Durie, MD. \  Patient: Maxwell Brown   DOB: [birth year]   56 y.o. Male  MRN: [id number] Visit Date: 08/23/2022  Today's healthcare provider: [name], MD   Chief Complaint  Patient presents with   Annual Exam   Subjective    [name] is a 56 y.o. male who presents today for a complete physical exam.  He reports consuming a general diet. Home exercise routine includes walking. He generally feels well. He reports sleeping fairly well. He does not have additional problems to discuss today.  Patient snores if he sleeps on his back in the bed but he sleeps in a recliner due to chronic back pain.  He is due for colonoscopy next year.  Had 2 adenomatous polyps in 2017 HPI    Past Medical History:  Diagnosis Date   Arthritis    Complication of anesthesia    2005 had a 6-7 hr surgery, he had difficulty whenever he got up, would pass out.  heart tests were normal  "anesthesia" related he was told   Gastritis    last flare up was 3 yrs ago   GERD (gastroesophageal reflux disease)    History of adenomatous polyps of colon 10/07/2016   Past Surgical History:  Procedure Laterality Date   BACK SURGERY     LUMBAR FUSION     L3, L4 2016   NECK SURGERY     UPPER GI ENDOSCOPY     Social History   Socioeconomic History   Marital status: Married    Spouse name: Not on file   Number of children: Not on file   Years of education: Not on file   Highest education level: Not on file  Occupational History   Occupation: Set designer  Tobacco Use   Smoking status: Never   Smokeless tobacco: Never  Vaping Use   Vaping Use: Never used  Substance and Sexual Activity   Alcohol use: No    Alcohol/week: 0.0 standard drinks of  alcohol   Drug use: No   Sexual activity: Not on file  Other Topics Concern   Not on file  Social History Narrative   Not on file   Social Determinants of Health   Financial Resource Strain: Low Risk  (04/26/2022)   Overall Financial Resource Strain (CARDIA)    Difficulty of Paying Living Expenses: Not hard at all  Food Insecurity: No Food Insecurity (04/26/2022)   Hunger Vital Sign    Worried About Running Out of Food in the Last Year: Never true    [place] in the Last Year: Never true  Transportation Needs: No Transportation Needs (04/26/2022)   PRAPARE - Hydrologist (Medical): No    Lack of Transportation (Non-Medical): No  Physical Activity: Sufficiently Active (04/26/2022)   Exercise Vital Sign    Days of Exercise per Week: 7 days    Minutes of Exercise per Session: 30 min  Stress: No Stress Concern Present (04/26/2022)   [place]    Feeling of Stress : Only a little  Social Connections: Moderately Integrated (04/26/2022)   Social Connection and Isolation Panel [NHANES]    Frequency of Communication with  Friends and Family: More than three times a week    Frequency of Social Gatherings with Friends and Family: Once a week    Attends Religious Services: More than 4 times per year    Active Member of [employer] or Organizations: No    Attends Archivist Meetings: Never    Marital Status: Married  Human resources officer Violence: Not At Risk (04/26/2022)   Humiliation, Afraid, Rape, and Kick questionnaire    Fear of Current or Ex-Partner: No    Emotionally Abused: No    Physically Abused: No    Sexually Abused: No   Family Status  Relation Name Status   Mother  Deceased   Father  Deceased   Brother  Deceased   Brother  Alive   Brother  Alive   Neg Hx  (Not Specified)   Family History  Problem Relation Age of Onset   Diabetes Mother    Heart disease Mother     Transient ischemic attack Mother    Bone cancer Father    Lung cancer Father    Colon cancer Neg Hx    Prostate cancer Neg Hx    Allergies  Allergen Reactions   Other     Pt stated he had a surgery on 10/19/2004, was under deep anaesthesia, did well waking up, but had continuous episodes of passing out when trying to stand.    Betadine [Povidone Iodine] Hives and Rash    Blistered skin     Patient Care Team: Jerrol Banana., MD as PCP - General (Unknown Physician Specialty)   Medications: Outpatient Medications Prior to Visit  Medication Sig   diphenhydrAMINE (BENADRYL) 25 MG tablet Take 25 mg by mouth every 6 (six) hours as needed.   fluticasone (FLONASE) 50 MCG/ACT nasal spray Place 2 sprays into both nostrils daily.   HYDROcodone-acetaminophen (NORCO) 10-325 MG tablet Take 1 tablet by mouth every 4 (four) hours as needed.   lansoprazole (PREVACID) 15 MG capsule Take 15 mg by mouth daily at 12 noon.   meloxicam (MOBIC) 15 MG tablet Take 1 tablet (15 mg total) by mouth daily as needed for pain.   metaxalone (SKELAXIN) 800 MG tablet Take 1 tablet (800 mg total) by mouth every 4 (four) hours as needed for muscle spasms.   Turmeric Curcumin 500 MG CAPS Take by mouth.   valACYclovir (VALTREX) 500 MG tablet TAKE 1 TABLET BY MOUTH TWICE A DAY   [DISCONTINUED] predniSONE (DELTASONE) 20 MG tablet Take 1 tablet (20 mg total) by mouth daily with breakfast. (Patient not taking: Reported on 08/23/2022)   [DISCONTINUED] sulfamethoxazole-trimethoprim (BACTRIM DS) 800-160 MG tablet Take 1 tablet by mouth 2 (two) times daily. (Patient not taking: Reported on 08/23/2022)   No facility-administered medications prior to visit.    Review of Systems  Musculoskeletal:  Positive for arthralgias, back pain, joint swelling, neck pain and neck stiffness.  All other systems reviewed and are negative.   Last metabolic panel Lab Results  Component Value Date   GLUCOSE 92 08/24/2022   NA 142  08/24/2022   K 4.3 08/24/2022   CL 108 (H) 08/24/2022   CO2 21 08/24/2022   BUN 9 08/24/2022   CREATININE 0.82 08/24/2022   EGFR 103 08/24/2022   CALCIUM 8.9 08/24/2022   PROT 6.2 08/24/2022   ALBUMIN 4.3 08/24/2022   LABGLOB 1.9 08/24/2022   AGRATIO 2.3 (H) 08/24/2022   BILITOT 0.6 08/24/2022   ALKPHOS 78 08/24/2022   AST 17 08/24/2022  ALT 12 08/24/2022   ANIONGAP 7 01/22/2015      Objective     BP 130/78 (BP Location: Right Arm, Patient Position: Sitting, Cuff Size: Large)   Pulse 84   Resp 16   Wt 292 lb (132.5 kg)   SpO2 100%   BMI 41.90 kg/m  BP Readings from Last 3 Encounters:  08/23/22 130/78  07/01/22 110/74  04/27/22 132/90   Wt Readings from Last 3 Encounters:  08/23/22 292 lb (132.5 kg)  04/27/22 284 lb (128.8 kg)  04/26/22 297 lb (134.7 kg)       Physical Exam Vitals reviewed.  Constitutional:      Appearance: He is well-developed. He is obese.  HENT:     Head: Normocephalic and atraumatic.     Right Ear: Tympanic membrane and external ear normal.     Left Ear: Tympanic membrane and external ear normal.     Nose: Nose normal.     Mouth/Throat:     Pharynx: Oropharynx is clear.  Eyes:     General: No scleral icterus.    Conjunctiva/sclera: Conjunctivae normal.  Neck:     Thyroid: No thyromegaly.  Cardiovascular:     Rate and Rhythm: Normal rate and regular rhythm.     Heart sounds: Normal heart sounds.  Pulmonary:     Effort: Pulmonary effort is normal.     Breath sounds: Normal breath sounds.  Abdominal:     General: Abdomen is flat.     Palpations: Abdomen is soft.  Musculoskeletal:     Comments: He has trace bilateral edema of lower extremity  Lymphadenopathy:     Cervical: No cervical adenopathy.  Skin:    General: Skin is warm and dry.  Neurological:     General: No focal deficit present.     Mental Status: He is alert and oriented to person, place, and time.  Psychiatric:        Mood and Affect: Mood normal.         Behavior: Behavior normal.        Thought Content: Thought content normal.        Judgment: Judgment normal.       Last depression screening scores    08/23/2022    9:23 AM 04/26/2022   11:36 AM 03/16/2022    4:13 PM  PHQ 2/9 Scores  PHQ - 2 Score 0 0 0  PHQ- 9 Score 0 0 0   Last fall risk screening    08/23/2022    9:23 AM  [place] in the past year? 0  Number falls in past yr: 0  Injury with Fall? 0  Risk for fall due to : No Fall Risks  Follow up Falls evaluation completed   Last Audit-C alcohol use screening    08/23/2022    9:23 AM  Alcohol Use Disorder Test (AUDIT)  1. How often do you have a drink containing alcohol? 0  2. How many drinks containing alcohol do you have on a typical day when you are drinking? 0  3. How often do you have six or more drinks on one occasion? 0  AUDIT-C Score 0   A score of 3 or more in women, and 4 or more in men indicates increased risk for alcohol abuse, EXCEPT if all of the points are from question 1   No results found for any visits on 08/23/22.  Assessment & Plan    Routine Health Maintenance and Physical Exam  Exercise Activities and Dietary recommendations  Goals      DIET - EAT MORE FRUITS AND VEGETABLES        Immunization History  Administered Date(s) Administered   Influenza Inj Mdck Quad Pf 09/12/2017, 09/07/2021   Influenza,inj,Quad PF,6+ Mos 09/13/2016, 08/23/2018, 09/27/2020, 08/23/2022   Influenza-Unspecified 09/12/2015   Tdap 01/04/2011, 08/06/2021    Health Maintenance  Topic Date Due   COVID-19 Vaccine (1) Never done   HIV Screening  Never done   Hepatitis C Screening  Never done   Zoster Vaccines- Shingrix (1 of 2) Never done   COLONOSCOPY (Pts 45-53yr Insurance coverage will need to be confirmed)  10/05/2023   TETANUS/TDAP  08/07/2031   INFLUENZA VACCINE  Completed   HPV VACCINES  Aged Out    Discussed health benefits of physical activity, and encouraged him to engage in regular  exercise appropriate for his age and condition.  1. Annual physical exam Colonoscopy in 2024.  Consider flu shot and Shingrix - Lipid panel - TSH - CBC w/Diff/Platelet - Comprehensive Metabolic Panel (CMET)  2. Borderline diabetes Diet exercise and weight loss are stressed - Lipid panel - TSH - CBC w/Diff/Platelet - Comprehensive Metabolic Panel (CMET)  3. Essential (primary) hypertension  - Lipid panel - TSH - CBC w/Diff/Platelet - Comprehensive Metabolic Panel (CMET)  4. Allergic rhinitis, unspecified seasonality, unspecified trigger  - Lipid panel - TSH - CBC w/Diff/Platelet - Comprehensive Metabolic Panel (CMET)  5. Class 3 severe obesity due to excess calories with serious comorbidity and body mass index (BMI) of 40.0 to 44.9 in adult (HCC) Weight loss would help his chronic back pain do believe - Lipid panel - TSH - CBC w/Diff/Platelet - Comprehensive Metabolic Panel (CMET)  6. Screening for prostate cancer  - PSA  7. Need for influenza vaccination  - Flu Vaccine QUAD 642moM (Fluarix, Fluzone & Alfiuria Quad PF)   No follow-ups on file.     I, RiWilhemena DurieMD, have reviewed all documentation for this visit. The documentation on 08/25/22 for the exam, diagnosis, procedures, and orders are all accurate and complete.    Briannon Boggio GiCranford MonMD  [phone]) [phone])  [place]

## 2022-08-23 ENCOUNTER — Other Ambulatory Visit: Payer: Self-pay | Admitting: Family Medicine

## 2022-08-23 ENCOUNTER — Encounter: Payer: Self-pay | Admitting: Family Medicine

## 2022-08-23 ENCOUNTER — Ambulatory Visit (INDEPENDENT_AMBULATORY_CARE_PROVIDER_SITE_OTHER): Payer: Medicare Other | Admitting: Family Medicine

## 2022-08-23 VITALS — BP 130/78 | HR 84 | Resp 16 | Wt 292.0 lb

## 2022-08-23 DIAGNOSIS — J309 Allergic rhinitis, unspecified: Secondary | ICD-10-CM

## 2022-08-23 DIAGNOSIS — Z0000 Encounter for general adult medical examination without abnormal findings: Secondary | ICD-10-CM | POA: Diagnosis not present

## 2022-08-23 DIAGNOSIS — Z23 Encounter for immunization: Secondary | ICD-10-CM

## 2022-08-23 DIAGNOSIS — R7303 Prediabetes: Secondary | ICD-10-CM

## 2022-08-23 DIAGNOSIS — K2100 Gastro-esophageal reflux disease with esophagitis, without bleeding: Secondary | ICD-10-CM | POA: Diagnosis not present

## 2022-08-23 DIAGNOSIS — Z86010 Personal history of colonic polyps: Secondary | ICD-10-CM | POA: Diagnosis not present

## 2022-08-23 DIAGNOSIS — I10 Essential (primary) hypertension: Secondary | ICD-10-CM | POA: Diagnosis not present

## 2022-08-23 DIAGNOSIS — Z125 Encounter for screening for malignant neoplasm of prostate: Secondary | ICD-10-CM

## 2022-08-23 DIAGNOSIS — Z6841 Body Mass Index (BMI) 40.0 and over, adult: Secondary | ICD-10-CM

## 2022-08-24 DIAGNOSIS — J309 Allergic rhinitis, unspecified: Secondary | ICD-10-CM | POA: Diagnosis not present

## 2022-08-24 DIAGNOSIS — R7303 Prediabetes: Secondary | ICD-10-CM | POA: Diagnosis not present

## 2022-08-24 DIAGNOSIS — Z0000 Encounter for general adult medical examination without abnormal findings: Secondary | ICD-10-CM | POA: Diagnosis not present

## 2022-08-24 DIAGNOSIS — I10 Essential (primary) hypertension: Secondary | ICD-10-CM | POA: Diagnosis not present

## 2022-08-25 LAB — COMPREHENSIVE METABOLIC PANEL
ALT: 12 IU/L (ref 0–44)
AST: 17 IU/L (ref 0–40)
Albumin/Globulin Ratio: 2.3 — ABNORMAL HIGH (ref 1.2–2.2)
Albumin: 4.3 g/dL (ref 3.8–4.9)
Alkaline Phosphatase: 78 IU/L (ref 44–121)
BUN/Creatinine Ratio: 11 (ref 9–20)
BUN: 9 mg/dL (ref 6–24)
Bilirubin Total: 0.6 mg/dL (ref 0.0–1.2)
CO2: 21 mmol/L (ref 20–29)
Calcium: 8.9 mg/dL (ref 8.7–10.2)
Chloride: 108 mmol/L — ABNORMAL HIGH (ref 96–106)
Creatinine, Ser: 0.82 mg/dL (ref 0.76–1.27)
Globulin, Total: 1.9 g/dL (ref 1.5–4.5)
Glucose: 92 mg/dL (ref 70–99)
Potassium: 4.3 mmol/L (ref 3.5–5.2)
Sodium: 142 mmol/L (ref 134–144)
Total Protein: 6.2 g/dL (ref 6.0–8.5)
eGFR: 103 mL/min/{1.73_m2} (ref >59)

## 2022-08-25 LAB — TSH: TSH: 2.51 u[IU]/mL (ref 0.450–4.500)

## 2022-08-25 LAB — CBC WITH DIFFERENTIAL/PLATELET
Basophils Absolute: 0 10*3/uL (ref 0.0–0.2)
Basos: 1 %
EOS (ABSOLUTE): 0.2 10*3/uL (ref 0.0–0.4)
Eos: 4 %
Hematocrit: 44 % (ref 37.5–51.0)
Hemoglobin: 14.6 g/dL (ref 13.0–17.7)
Immature Grans (Abs): 0 10*3/uL (ref 0.0–0.1)
Immature Granulocytes: 0 %
Lymphocytes Absolute: 2.2 10*3/uL (ref 0.7–3.1)
Lymphs: 33 %
MCH: 30.4 pg (ref 26.6–33.0)
MCHC: 33.2 g/dL (ref 31.5–35.7)
MCV: 92 fL (ref 79–97)
Monocytes Absolute: 0.6 10*3/uL (ref 0.1–0.9)
Monocytes: 9 %
Neutrophils Absolute: 3.7 10*3/uL (ref 1.4–7.0)
Neutrophils: 53 %
Platelets: 213 10*3/uL (ref 150–450)
RBC: 4.8 x10E6/uL (ref 4.14–5.80)
RDW: 13.5 % (ref 11.6–15.4)
WBC: 6.9 10*3/uL (ref 3.4–10.8)

## 2022-08-25 LAB — LIPID PANEL
Chol/HDL Ratio: 4.1 ratio (ref 0.0–5.0)
Cholesterol, Total: 172 mg/dL (ref 100–199)
HDL: 42 mg/dL (ref >39)
LDL Chol Calc (NIH): 107 mg/dL — ABNORMAL HIGH (ref 0–99)
Triglycerides: 128 mg/dL (ref 0–149)
VLDL Cholesterol Cal: 23 mg/dL (ref 5–40)

## 2022-08-25 LAB — PSA: Prostate Specific Ag, Serum: 1.8 ng/mL (ref 0.0–4.0)

## 2022-11-23 ENCOUNTER — Telehealth: Payer: Self-pay | Admitting: Family Medicine

## 2022-11-23 NOTE — Telephone Encounter (Signed)
Medication Refill - Medication:   Has the patient contacted their pharmacy? Yes.   (Agent: If no, request that the pati  Disp Refills Start End   valACYclovir (VALTREX) 500 MG tablet       ent contact the pharmacy for the refill. If patient does not wish to contact the pharmacy document the reason why and proceed with request.) (Agent: If yes, when and what did the pharmacy advise?)  Preferred Pharmacy (with phone number or street name):  [id number] [place] [phone] Fax: [phone]    Has the patient been seen for an appointment in the last year OR does the patient have an upcoming appointment? Yes.    Agent: Please be advised that RX refills may take up to 3 business days. We ask that you follow-up with your pharmacy.

## 2022-11-24 MED ORDER — VALACYCLOVIR HCL 500 MG PO TABS: 500.0000 mg | ORAL_TABLET | Freq: Two times a day (BID) | ORAL | 0 refills | Status: AC

## 2023-01-10 IMAGING — CR DG ABDOMEN 1V
1 series · 2 of 2 positions shown · non-contrast
Comparison: Radiograph dated April 06, 2016

CLINICAL DATA: right flank pain, possile kidney stone or possible
nephritis

EXAM:
ABDOMEN - 1 VIEW

[Series 1: dg abd 1 view · 0.14mm/px · 2 of 2 slices shown]
[im 1/2]
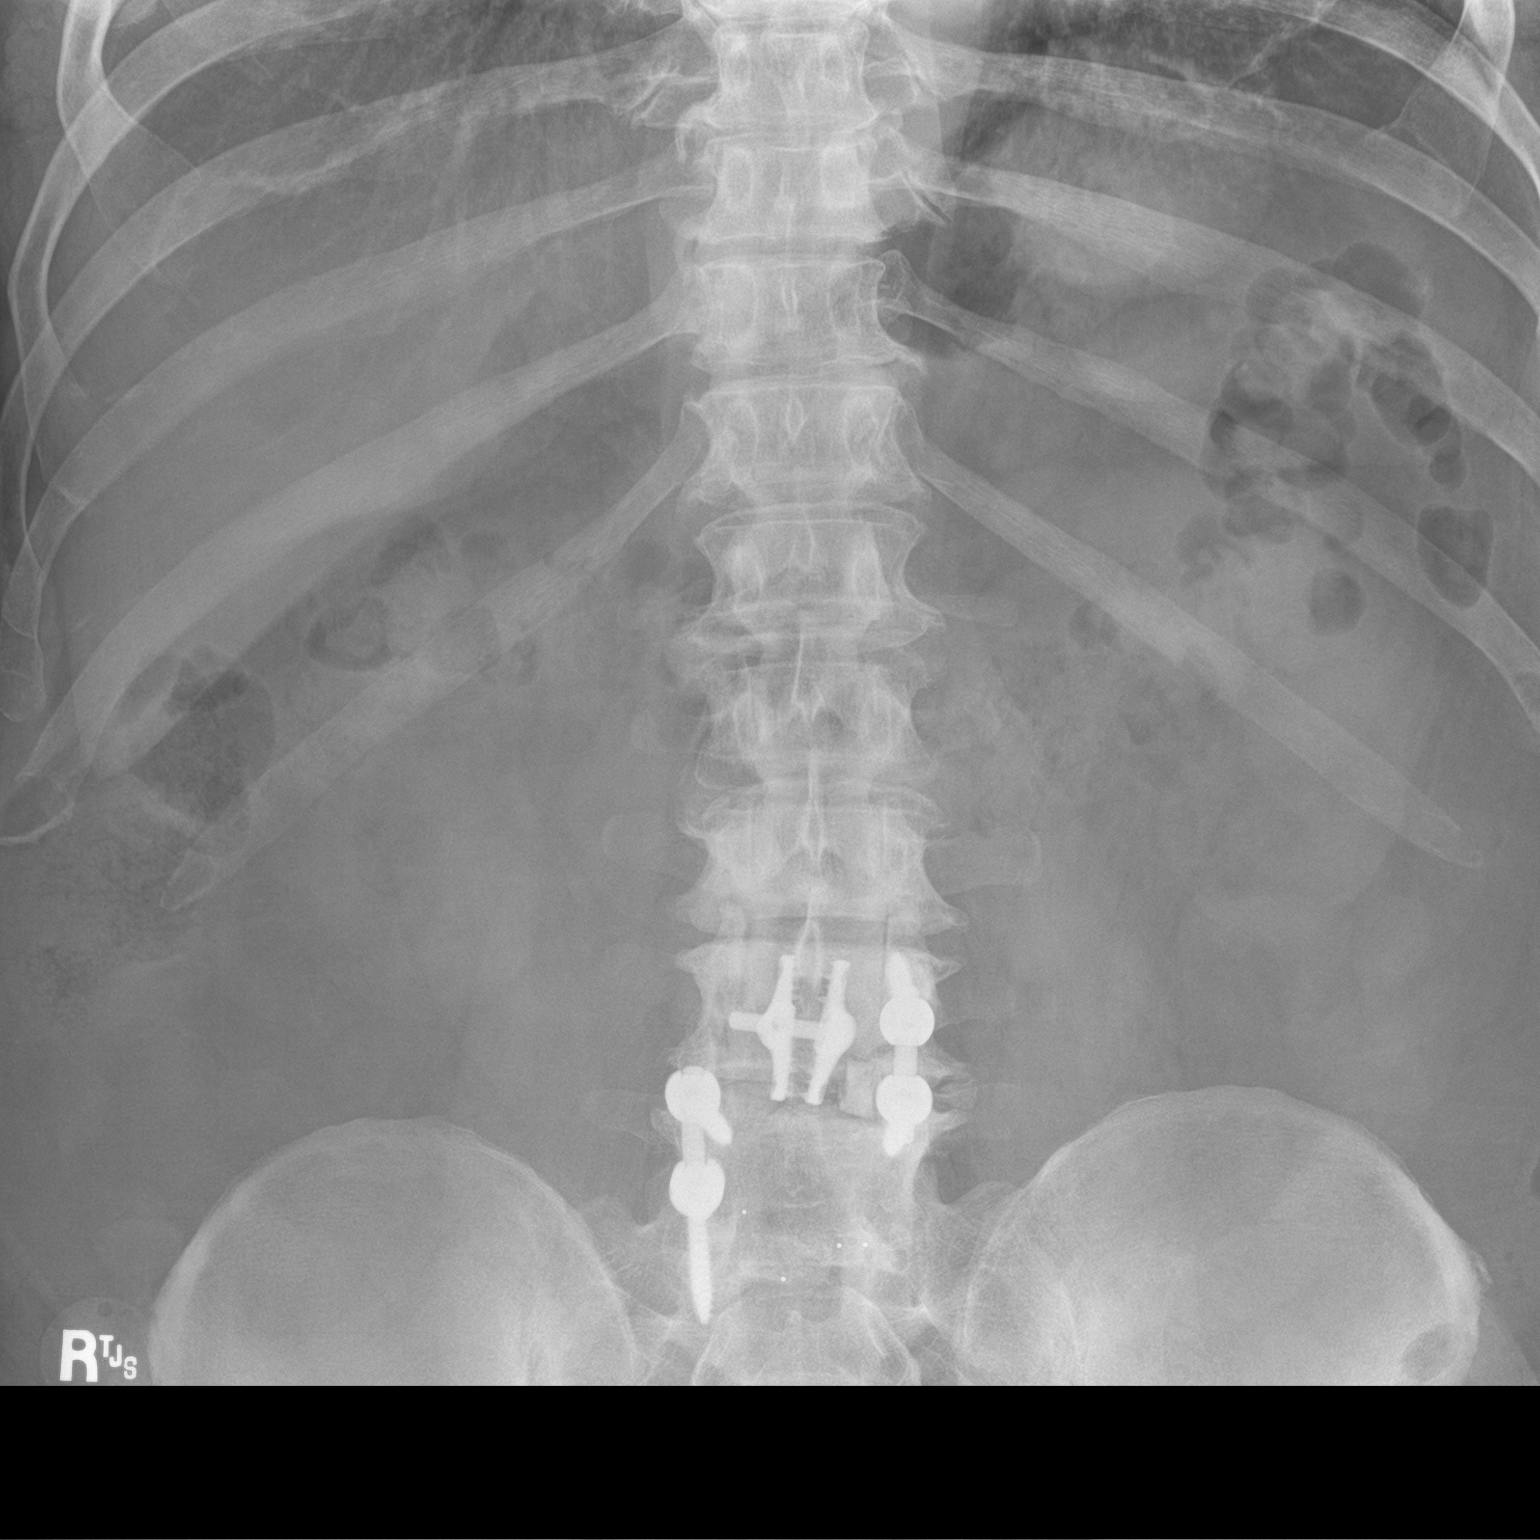
[im 2/2]
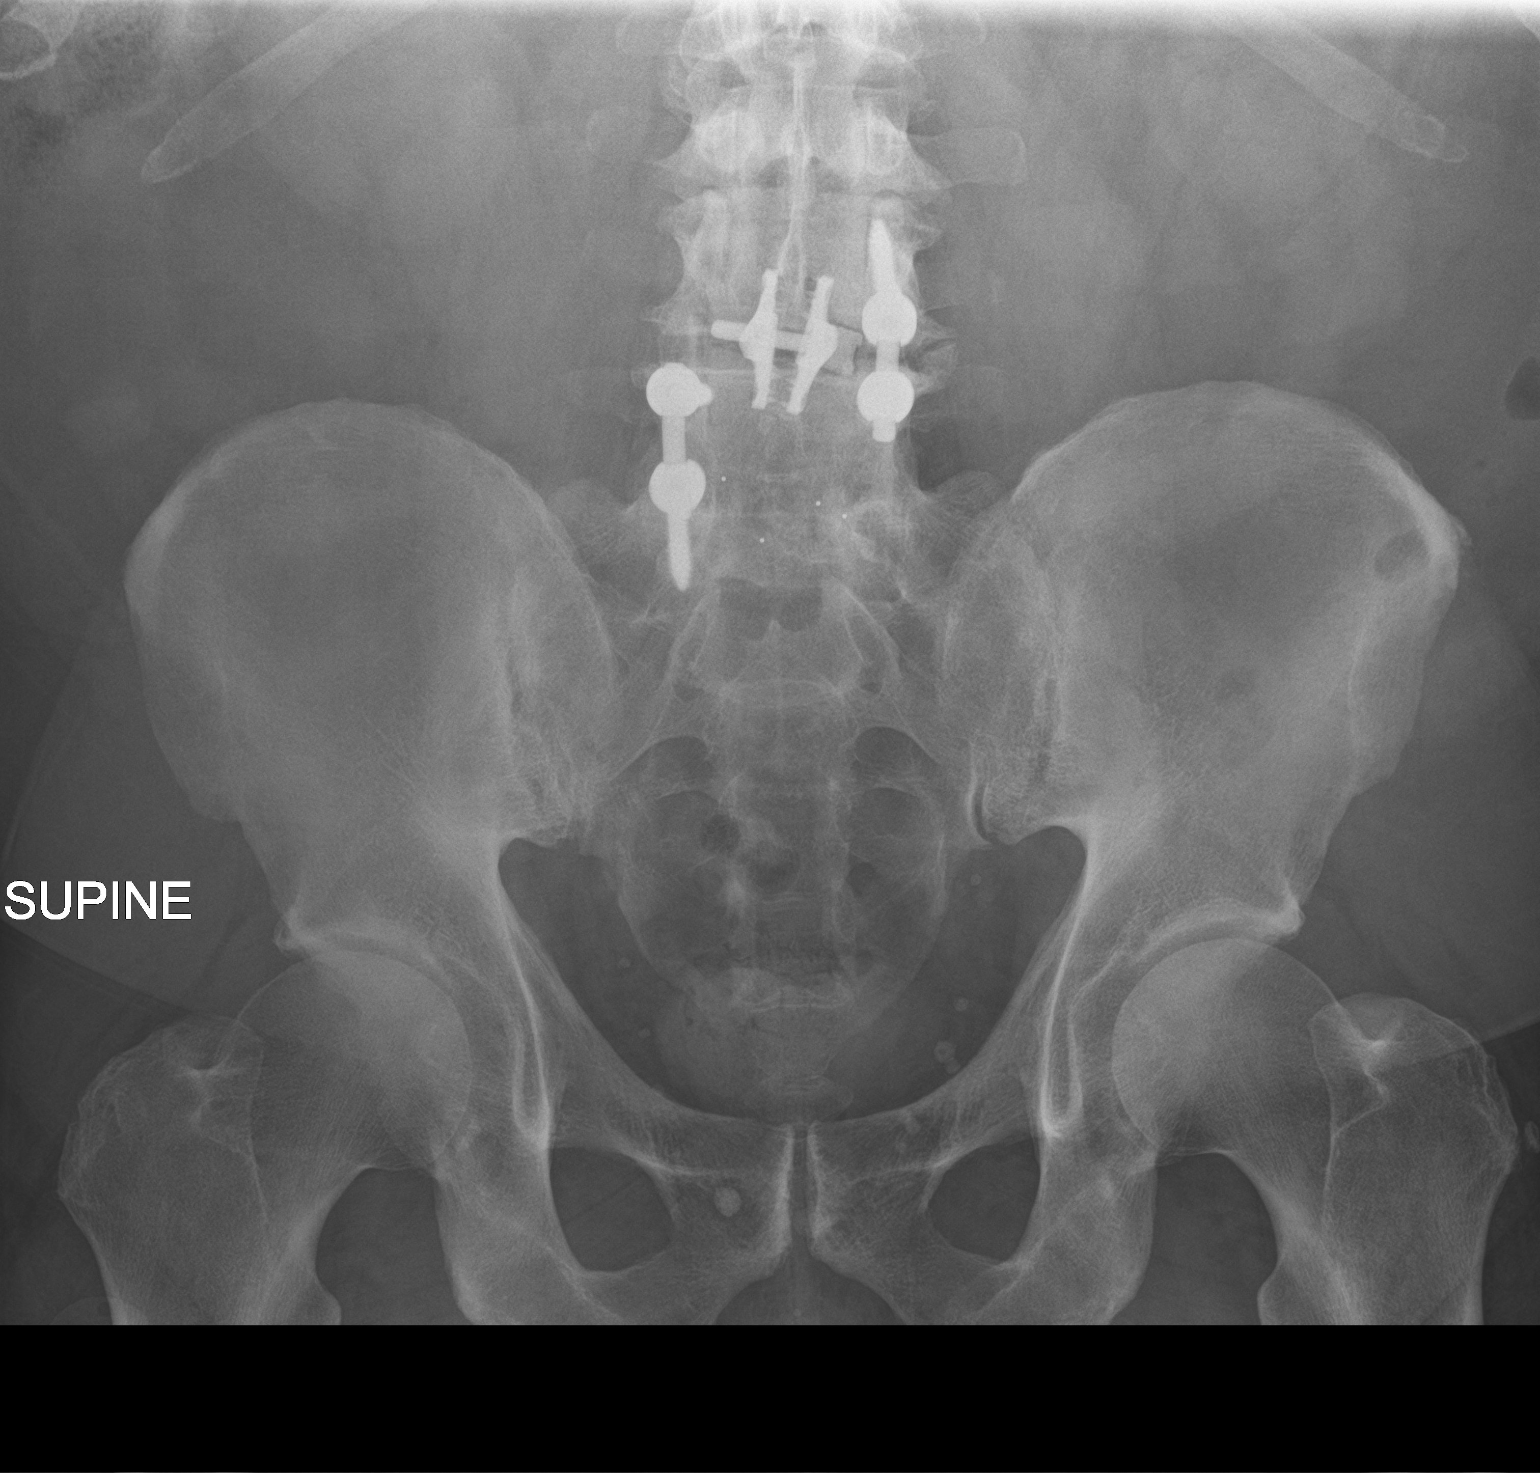

[2 of 2 positions shown; findings below may reference images not displayed]

FINDINGS: The bowel gas pattern is normal. No radio-opaque calculi or other
significant radiographic abnormality are seen. Pelvic phleboliths.
Status post orthopedic hardware placement in the lower lumbar spine.
IMPRESSION: No definitive nephrolithiasis are visualized. If persistent concern,
recommend dedicated cross-sectional imaging.

## 2023-01-28 DIAGNOSIS — M159 Polyosteoarthritis, unspecified: Secondary | ICD-10-CM | POA: Diagnosis not present

## 2023-01-28 DIAGNOSIS — Z6838 Body mass index (BMI) 38.0-38.9, adult: Secondary | ICD-10-CM | POA: Diagnosis not present

## 2023-01-28 DIAGNOSIS — I10 Essential (primary) hypertension: Secondary | ICD-10-CM | POA: Diagnosis not present

## 2023-01-28 DIAGNOSIS — R7303 Prediabetes: Secondary | ICD-10-CM | POA: Diagnosis not present

## 2023-01-28 DIAGNOSIS — M5126 Other intervertebral disc displacement, lumbar region: Secondary | ICD-10-CM | POA: Diagnosis not present

## 2023-02-15 ENCOUNTER — Telehealth: Payer: Self-pay | Admitting: Family Medicine

## 2023-02-15 NOTE — Telephone Encounter (Signed)
Contacted Maxwell Brown to schedule their annual wellness visit. Patient declined to schedule AWV at this time.Transferred care to [place] new office.  [place] Direct Dial: [phone]

## 2023-03-01 ENCOUNTER — Telehealth: Payer: Self-pay | Admitting: Family Medicine

## 2023-03-01 NOTE — Telephone Encounter (Signed)
Contacted Cobin A Sturgeon to schedule their annual wellness visit. Patient declined to schedule AWV at this time.Transferred care to Dr.Gilberts new office.  Bernice Cicero Care Guide [facility] AWV TEAM Direct Dial: [phone]   

## 2023-07-14 ENCOUNTER — Telehealth: Payer: Self-pay

## 2023-07-14 NOTE — Telephone Encounter (Signed)
LVM for patient to call back [phone], or to call PCP office to schedule follow up apt. AS, CMA
